# Patient Record
Sex: Male | Born: 1935 | Race: Black or African American | Hispanic: No | Marital: Married | Smoking: Never smoker
Health system: Southern US, Community
[De-identification: ages and names within clinical notes are randomized; demographics above are authoritative.]

## PROBLEM LIST (undated history)

## (undated) DIAGNOSIS — R269 Unspecified abnormalities of gait and mobility: Secondary | ICD-10-CM

## (undated) DIAGNOSIS — R569 Unspecified convulsions: Secondary | ICD-10-CM

## (undated) DIAGNOSIS — F0390 Unspecified dementia without behavioral disturbance: Secondary | ICD-10-CM

## (undated) DIAGNOSIS — R0989 Other specified symptoms and signs involving the circulatory and respiratory systems: Secondary | ICD-10-CM

## (undated) HISTORY — DX: Unspecified convulsions: R56.9

## (undated) HISTORY — PX: FOOT SURGERY: SHX648

---
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
## 1998-05-22 ENCOUNTER — Encounter: Payer: Self-pay | Admitting: Emergency Medicine

## 1998-05-22 ENCOUNTER — Inpatient Hospital Stay (HOSPITAL_COMMUNITY): Admission: EM | Admit: 1998-05-22 | Discharge: 1998-05-28 | Payer: Self-pay | Admitting: Emergency Medicine

## 1998-05-23 ENCOUNTER — Encounter: Payer: Self-pay | Admitting: Pulmonary Disease

## 1998-12-03 ENCOUNTER — Emergency Department (HOSPITAL_COMMUNITY): Admission: EM | Admit: 1998-12-03 | Discharge: 1998-12-03 | Payer: Self-pay | Admitting: Emergency Medicine

## 1998-12-06 ENCOUNTER — Emergency Department (HOSPITAL_COMMUNITY): Admission: EM | Admit: 1998-12-06 | Discharge: 1998-12-06 | Payer: Self-pay | Admitting: Emergency Medicine

## 1998-12-06 ENCOUNTER — Encounter: Payer: Self-pay | Admitting: Emergency Medicine

## 1998-12-11 ENCOUNTER — Encounter: Payer: Self-pay | Admitting: Emergency Medicine

## 1998-12-11 ENCOUNTER — Emergency Department (HOSPITAL_COMMUNITY): Admission: EM | Admit: 1998-12-11 | Discharge: 1998-12-11 | Payer: Self-pay | Admitting: Emergency Medicine

## 1998-12-18 ENCOUNTER — Encounter: Payer: Self-pay | Admitting: *Deleted

## 1998-12-18 ENCOUNTER — Emergency Department (HOSPITAL_COMMUNITY): Admission: EM | Admit: 1998-12-18 | Discharge: 1998-12-18 | Payer: Self-pay | Admitting: Emergency Medicine

## 1999-02-06 ENCOUNTER — Ambulatory Visit (HOSPITAL_COMMUNITY): Admission: RE | Admit: 1999-02-06 | Discharge: 1999-02-06 | Payer: Self-pay | Admitting: Family Medicine

## 1999-02-06 ENCOUNTER — Encounter: Payer: Self-pay | Admitting: Family Medicine

## 2001-06-18 ENCOUNTER — Encounter: Payer: Self-pay | Admitting: Internal Medicine

## 2001-06-18 ENCOUNTER — Inpatient Hospital Stay (HOSPITAL_COMMUNITY): Admission: EM | Admit: 2001-06-18 | Discharge: 2001-06-24 | Payer: Self-pay | Admitting: Emergency Medicine

## 2001-06-18 ENCOUNTER — Encounter: Payer: Self-pay | Admitting: Emergency Medicine

## 2002-02-27 ENCOUNTER — Encounter: Payer: Self-pay | Admitting: Emergency Medicine

## 2002-02-27 ENCOUNTER — Emergency Department (HOSPITAL_COMMUNITY): Admission: EM | Admit: 2002-02-27 | Discharge: 2002-02-27 | Payer: Self-pay | Admitting: Emergency Medicine

## 2004-07-26 ENCOUNTER — Emergency Department (HOSPITAL_COMMUNITY): Admission: EM | Admit: 2004-07-26 | Discharge: 2004-07-26 | Payer: Self-pay | Admitting: Emergency Medicine

## 2004-08-10 ENCOUNTER — Ambulatory Visit (HOSPITAL_COMMUNITY): Admission: RE | Admit: 2004-08-10 | Discharge: 2004-08-10 | Payer: Self-pay | Admitting: Gastroenterology

## 2006-05-06 ENCOUNTER — Inpatient Hospital Stay (HOSPITAL_COMMUNITY): Admission: EM | Admit: 2006-05-06 | Discharge: 2006-05-14 | Payer: Self-pay | Admitting: Emergency Medicine

## 2008-02-12 ENCOUNTER — Emergency Department (HOSPITAL_COMMUNITY): Admission: EM | Admit: 2008-02-12 | Discharge: 2008-02-12 | Payer: Self-pay | Admitting: Emergency Medicine

## 2009-12-15 ENCOUNTER — Observation Stay (HOSPITAL_COMMUNITY): Admission: EM | Admit: 2009-12-15 | Discharge: 2009-12-16 | Payer: Self-pay | Admitting: Emergency Medicine

## 2010-09-17 LAB — RPR: RPR Ser Ql: NONREACTIVE

## 2010-09-17 LAB — DIFFERENTIAL
Basophils Absolute: 0.1 10*3/uL (ref 0.0–0.1)
Eosinophils Absolute: 0 10*3/uL (ref 0.0–0.7)
Eosinophils Relative: 0 % (ref 0–5)
Monocytes Absolute: 0.8 10*3/uL (ref 0.1–1.0)

## 2010-09-17 LAB — COMPREHENSIVE METABOLIC PANEL
ALT: 14 U/L (ref 0–53)
AST: 18 U/L (ref 0–37)
Albumin: 3.5 g/dL (ref 3.5–5.2)
CO2: 28 mEq/L (ref 19–32)
Chloride: 100 mEq/L (ref 96–112)
GFR calc Af Amer: >60 mL/min (ref >60)
GFR calc non Af Amer: >60 mL/min (ref >60)
Potassium: 3.7 mEq/L (ref 3.5–5.1)
Sodium: 135 mEq/L (ref 135–145)
Total Bilirubin: 0.3 mg/dL (ref 0.3–1.2)

## 2010-09-17 LAB — CBC
HCT: 34.8 % — ABNORMAL LOW (ref 39.0–52.0)
Hemoglobin: 11.6 g/dL — ABNORMAL LOW (ref 13.0–17.0)
MCV: 97.6 fL (ref 78.0–100.0)
MCV: 98.1 fL (ref 78.0–100.0)
Platelets: 145 10*3/uL — ABNORMAL LOW (ref 150–400)
RBC: 3.81 MIL/uL — ABNORMAL LOW (ref 4.22–5.81)
WBC: 4.4 10*3/uL (ref 4.0–10.5)
WBC: 4.9 10*3/uL (ref 4.0–10.5)

## 2010-09-17 LAB — BASIC METABOLIC PANEL
BUN: 6 mg/dL (ref 6–23)
Chloride: 102 mEq/L (ref 96–112)
GFR calc non Af Amer: >60 mL/min (ref >60)
Potassium: 3.7 mEq/L (ref 3.5–5.1)
Sodium: 141 mEq/L (ref 135–145)

## 2010-09-17 LAB — URINALYSIS, ROUTINE W REFLEX MICROSCOPIC
Glucose, UA: NEGATIVE mg/dL
Hgb urine dipstick: NEGATIVE
Ketones, ur: NEGATIVE mg/dL
Protein, ur: NEGATIVE mg/dL
pH: 7 (ref 5.0–8.0)

## 2010-09-17 LAB — VITAMIN B12: Vitamin B-12: 450 pg/mL (ref 211–911)

## 2010-09-17 LAB — ALBUMIN: Albumin: 3.1 g/dL — ABNORMAL LOW (ref 3.5–5.2)

## 2010-09-17 LAB — VALPROIC ACID LEVEL: Valproic Acid Lvl: 49.7 ug/mL — ABNORMAL LOW (ref 50.0–100.0)

## 2010-09-17 LAB — PHENYTOIN LEVEL, FREE AND TOTAL
Phenytoin Bound: 13.9 mg/L
Phenytoin, Total: 16.3 mg/L (ref 10.0–20.0)

## 2010-09-17 LAB — TSH: TSH: 0.501 u[IU]/mL (ref 0.350–4.500)

## 2010-09-17 LAB — PHENYTOIN LEVEL, TOTAL: Phenytoin Lvl: 5.5 ug/mL — ABNORMAL LOW (ref 10.0–20.0)

## 2010-11-17 NOTE — H&P (Signed)
NAMEBOSTYN, BOGIE                 ACCOUNT NO.:  [id number]   MEDICAL RECORD NO.:  [id number]          PATIENT TYPE:  EMS   LOCATION:  MAJO                         FACILITY:  [facility]   PHYSICIAN:  Lonia Blood, M.D.DATE OF BIRTH:  [birth year]   DATE OF ADMISSION:  05/05/2006  DATE OF DISCHARGE:                              HISTORY & PHYSICAL   PRIMARY CARE PHYSICIAN:  Unassigned.   CHIEF COMPLAINT:  Altered mental status.   HISTORY OF PRESENT ILLNESS:  Mr. Samuel Kirk is a 76 year old gentleman  who presented to the [facility] Emergency Room with family this evening  with complaints of altered mental status.  No family is present at the  present time and further history is not available.  The patient himself  is confused and unable to provide a reliable history.  The information  that I have is provided by the emergency room physician.  Apparently,  the patient was noted to be confused by family today.  They stated that  he was stumbling around when he is normally able to walk.  They said  that his speech was garbled and unintelligible.  He was brought to the  emergency room for evaluation.  In the emergency room, the patient was  found to have an elevated Dilantin level at 33.  I was called to admit  the patient for Dilantin toxicity.  At the present time, the patient is  alert.  He Fahd not answer questions from the examiner.  He Zandon turn  and look at the examiner and he Monterrius follow simple questions.  He is  moving all 4 extremities spontaneously.   REVIEW OF SYSTEMS:  Comprehensive review of systems cannot be  accomplished, as the patient is altered in his mental state and unable  to provide history.   PAST MEDICAL HISTORY:  (Per old records.)  1. Possible history of hypertension.  2. Seizure disorder thought to be secondary to alcoholism.  3. Normal colonoscopy, February 2006.  4. History of alcoholism.   OUTPATIENT MEDICATIONS:  Doses unknown:  1. Depakote.  2.  Dilantin.  3. Tagamet.   ALLERGIES:  No known drug allergies per old records.   FAMILY HISTORY:  The patient's mother and father both died of old age  with no significant medical history per old records.   SOCIAL HISTORY:  Social history is not able to be obtained with the  exception to the fact that we know he previously chewed tobacco and was  an alcoholic.   DATA REVIEW:  White count is low at 2.3, platelet count was borderline  low at 119, hemoglobin is low at 12.8 with an MCV of 96.  Sodium is  normal, potassium is low at 3.4; chloride, bicarb, BUN and creatinine  are normal.  Serum glucose is elevated at 138.  Calcium is 9.3.  LFTs  are normal.  Albumin is marginally low-normal at 3.6.  Dilantin level is  33, which corrects to 40 at his present albumin level.  Depakote level  is 51.   CT scan of the head reveals no acute disease.  Twelve-lead EKG reveals sinus bradycardia at 58 beats per minute.   PHYSICAL EXAMINATION:  VITAL SIGNS:  Temperature 97.3, blood pressure  151/72, heart rate 58, respiratory rate 18 and O2 SAT is 100% on room  air, CBG 132.  GENERAL:  Thin, cachectic male in no acute respiratory distress.  HEENT:  Normocephalic, atraumatic.  Pupils are equal and sluggish  bilateral.  Oral cavity reveals poor dental health, but moist mucous  membranes.  NECK:  No JVD.  No lymphadenopathy.  LUNGS:  Clear to auscultation bilaterally without wheezes or rhonchi.  CARDIOVASCULAR:  Regular rate and rhythm without murmur, gallop or rub,  with normal S1 and S2.  ABDOMEN:  Nontender, non-distended, soft.  Bowel sounds present.  No  hepatosplenomegaly.  No rebound.  No ascites.  EXTREMITIES:  Cachectic extremities with no cyanosis, clubbing or edema.  NEUROLOGIC:  The patient is alert.  He mumbles unintelligible answers  when asked questions.  He is able to follow simple commands.  His facial  features are symmetric.  He moves all 4 extremities spontaneously and  there  is no Babinski.   IMPRESSION AND PLAN:  1. Altered mental status.  It is not clear if Mr. Sotelo is suffering      with altered mental status due to his Depakote level or due to      other toxins.  He has a history of alcoholism.  I Stephan check a      urine drug screen and also an alcohol level; these have not been      obtained in the emergency room.  I Delvon place the patient on      empiric thiamine and folate.  We Kowen place the patient in the      hospital for observation.  CT scan of the head has been obtained      and is without evidence of acute disease.  If the patient is      unimproved in the morning, we Giancarlo need to consider MRI of the head      to rule out cerebrovascular accident.  2. Dilantin toxicity.  The patient is indeed suffering with a      significant Dilantin toxicity.  We Cobain of course hold Dilantin.      We Parth hydrate the patient carefully.  3. History of alcoholism.  The patient's physical exam as well as his      near-pancytopenia suggest that he continues to abuse alcohol.  I      Sandro place him on empiric thiamine and folate.  Given that he is      confused at the present time, I Thao not place him on standing      benzodiazepines.  We Delwin need to follow him closely for alcohol      withdrawal.  4. Mild pancytopenia.  I suspect this is indicative of ongoing alcohol      abuse.  We Taft follow up a CBC in approximately 48 hours.      Lonia Blood, M.D.  Electronically Signed     JTM/MEDQ  D:  05/06/2006  T:  05/06/2006  Job:  [id number]

## 2010-11-17 NOTE — Discharge Summary (Signed)
[place]  Patient:    Samuel Kirk, Samuel Kirk Visit Number: [id number] MRN: [id number]          Service Type: MED Location: 5000 [id number] Attending Physician:  Phifer, Trinna Post Dictated by:   Ladell Pier, M.D. Admit Date:  06/18/2001 Disc. Date: 06/23/01                             Discharge Summary  DISCHARGE DIAGNOSES: 1. Altered mental status secondary to several medical problems including    alcoholism. 2. Seizure disorder secondary to alcohol. 3. History of ethanol abuse.  MEDICATIONS ON DISCHARGE: 1. Risperdal 1 mg p.o. b.i.d. 2. Multivitamin. 3. Depakote 750 mg p.o. q.h.s. 4. Thiamine 100 mg q.d. x 1 month.  FOLLOW-UP:  The patient can follow up with outpatient clinic at [phone].  CONSULTS:  Dr. Antonietta Breach, psychiatry, June 20, 2001.  PROCEDURES:  None.  HISTORY OF PRESENT ILLNESS:  Samuel Kirk is a 76 year old African-American male with past medical history significant for ethanol abuse leading to seizure disorder.  He recently moved from [place], [place] where he was living with his sister; he now lives with his daughter here in [place] since April 2002.  The patient has been out of medications for the past month.  Since Thanksgiving he had been acting strange with increased confusion, he refuses to bathe, he has not slept for the past two days.  The morning of admission he started yelling "the Shaune Pollack is coming" and going through the knife drawers in the kitchen.  He also had several spells, each lasting for a few seconds; his normal seizure pattern is tonic-clonic.  He has no complaints of chest pain, nausea, vomiting, diarrhea, or increased shortness of breath.  No headaches. Most of the information obtained for the interview was obtained from his daughter.  PAST MEDICAL HISTORY: 1. Ethanol abuse with DTs. 2. Seizure disorder. 3. Question of hypertension.  FAMILY HISTORY:  Mother died of old age.   Father died of old age.  Grandmother had Alzheimers.  SOCIAL HISTORY:  Chews tobacco.  Was a heavy drinker in the past; two years since he quit.  Lives with his daughter.  MEDICATIONS: 1. Depakote 250 mg three p.o. q.h.s. 2. Risperdal 0.5 mg p.o. q.12h. p.r.n. for agitation. 3. Multivitamin.  ALLERGIES:  No known drug allergies.  REVIEW OF SYSTEMS:  Positive for visual changes, dry cough, hypertension.  PHYSICAL EXAMINATION:  VITAL SIGNS:  Temperature 98, blood pressure 160/94, pulse 97, pulse oximetry 100% on room air.  GENERAL:  Patient is sitting up in bed, appears his stated age, in no apparent distress.  HEENT:  Head normocephalic, atraumatic.  Pupils are equal, round and reactive to light.  Small pterygium in the right eye.  Throat shows no erythema.  CARDIOVASCULAR:  Regular rate and rhythm, S1, S2, constant intensity.  No murmurs, rubs, or gallops.  PMI is at the left sternal border.  No JVD, no carotid bruits.  Enlarged A wave.  LUNGS:  Clear to auscultation bilaterally.  No wheezes, rhonchi, or crackles.  ABDOMEN:  Soft, nontender, nondistended.  Positive bowel sounds.  RECTAL:  Deferred.  EXTREMITIES:  No edema, 2+ DP pulse bilaterally.  Deformed fourth toe on the left foot.  LYMPHATICS:  No lymphadenopathy.  NEUROLOGIC:  Cranial nerves 2-12 intact.  Strength 5/5 throughout all extremities.  DTRs 2+ throughout.  PSYCHIATRIC:  He is oriented to place only.  Follows commands.  Poor insight.  HOSPITAL COURSE: #1 - ALTERED MENTAL STATUS:  Patient was worked up for altered mental status, was negative for infection, head CT was negative ruling out stroke, RPR was negative, HIV negative, TSH was within normal limits, B12 was normal, urine drug screen was negative.  EKG was negative, cardiac enzymes negative.  No organic etiology for patients altered mental status.  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 Jeanie Sewer of psychiatry.  Patient was started on risperidol 1 mg b.i.d. and mental status remained the same throughout his admission course.  #2 - SEIZURE DISORDER:  Patient has a history of seizure disorder because of his alcohol use.  He was out of his Depakote.  Patient was restarted on Depakote during his admission and at last blood draw a Depakote level was 45.1.  Patient had no episodes of seizure during his admission stay.  #3 - HYPERTENSION:  On admission, patients blood pressure was elevated. During his stay blood pressure normalized to 121/63 and stayed within normal range during his hospitalization.  DISPOSITION:  Patient Samuel Kirk be discharged to intermediate care facility.  LABORATORY DATA ON DISCHARGE:  Valproic acid 45.1.  Dilantin less than 0.1. B12 586.  Ethanol less than 10.  RPR negative.  HIV negative.  Magnesium 2.2. Cardiac enzymes with CK 93, MB 1.5, troponin 0.01.  TSH 1.258.  CMP:  Sodium 143, potassium 3.8, chloride 104, CO2 31, BUN 11, creatinine 0.1, glucose 112, calcium 9.9, total protein 8.2, albumin 3.7, AST 38, ALT 19, alk phos 73, total bili 1.3.  UA negative except for ketones 15 mg/dcl.  Urine drug screen negative.  CBC:  H&H 14.2/41.3, WBC 6.6, platelets 202.  Head CT showed atrophy and small-vessel disease, negative for acute abnormalities.  EKG showed normal sinus rhythm, rate of 75 beats per minute.  No ST elevation or depression. Dictated by:   Ladell Pier, M.D. Attending Physician:  Phifer, Trinna Post DD:  06/23/01 TD:  06/23/01 Job: [id number]

## 2010-11-17 NOTE — Discharge Summary (Signed)
Samuel Kirk, Samuel Kirk                 ACCOUNT NO.:  [id number]   MEDICAL RECORD NO.:  [id number]          PATIENT TYPE:  INP   LOCATION:  3729                         FACILITY:  [facility]   PHYSICIAN:  Madaline Savage, MD        DATE OF BIRTH:  10-Sep-1934   DATE OF ADMISSION:  05/05/2006  DATE OF DISCHARGE:  05/10/2006                                 DISCHARGE SUMMARY   PRIMARY CARE PHYSICIAN:  Unassigned.   PROCEDURES DONE IN THE HOSPITAL:  He had a CT of the head without contrast  done on August 06, 2005, which showed no acute intracranial pathology.  He  had an x-ray of the chest done on May 06, 2006, which showed limited  possible underlying COPD.  He had a MRA of the brain done on May 07, 2006, which showed brain atrophy without evidence of focal or acute finding.   HISTORY OF PRESENT ILLNESS:  Samuel Kirk is a 76 year old gentleman who has a  history of seizure disorder who presented to the [facility]  with altered mental status.  The patient was confused at the time and having  garbled speech.  At the time of admission, his Dilantin level was elevated  at 33 and he was admitted for observation.   HOSPITAL COURSE WITH PROBLEM LIST:  1. Altered mental status.  His altered mental status resolved and his      Dilantin level came down back to normal, so we believe it is the      Dilantin toxicity that caused his change in mental status.  We did a CT      of the head which was negative.  We did a MRI which did not show any      acute cerebrovascular accident.  He was taken off Dilantin for a few      days but now we are restarting his Dilantin at a lower dose.  He used      to take 100 mg 3 times daily.  We Viyaan start it at 100 mg twice a day.      The plan is to check a Dilantin level as an outpatient and adjust it      accordingly.  2. Dilantin toxicity, just resolved.  The last Dilantin level was within      the normal range and his CNS status is back to its  baseline.  3. History of seizure disorder.  He Hawken continue taking the Dilantin.  He      is also on Depakote 750 mg at bedtime.  Corrigan continue the medications.  4. History of alcoholism.  He is started on thiamine and folate while in      the hospital.  We Kevin continue it at the nursing home.   DISCHARGE MEDICATIONS:  1. Dilantin 100 mg twice daily.  2. Depakote 750 mg at bedtime.  3. Thiamine 100 mg daily.  4. Folic acid 1 mg daily.   DISPOSITION:  He Zacharey now be discharged to a nursing facility where he Arvle  get physical  therapy and followup.  He Dimarco followup with the physician at  the nursing home.  He Guilherme also get a Dilantin level in 1 week's time to  adjust the dose of Dilantin.      Madaline Savage, MD  Electronically Signed     PKN/MEDQ  D:  05/10/2006  T:  05/10/2006  Job:  [id number]

## 2010-11-17 NOTE — Discharge Summary (Signed)
Samuel Kirk, Samuel Kirk                 ACCOUNT NO.:  [id number]   MEDICAL RECORD NO.:  [id number]          PATIENT TYPE:  INP   LOCATION:  3729                         FACILITY:  [facility]   PHYSICIAN:  Madaline Savage, MD        DATE OF BIRTH:  03/27/1935   DATE OF ADMISSION:  05/05/2006  DATE OF DISCHARGE:                                 DISCHARGE SUMMARY   ADDENDUM TO DISCHARGE SUMMARY:  This gentleman did have a positive blood  culture while in the hospital.  A couple of blood cultures were done because  of change in mental status and one of them grew coagulase negative  staphylococcus.  The usual report was Gram negative cocci and clusters, so  we started him on Rocephin, but at the time of discharge, it has come back  as coagulase negative staph, which is staph epidermatitis which is  contaminant.  So we have stopped the Rocephin.  He never had a fever or  elevated white count or any source of infection.      Madaline Savage, MD  Electronically Signed     PKN/MEDQ  D:  05/10/2006  T:  05/10/2006  Job:  [id number]

## 2010-11-17 NOTE — Op Note (Signed)
NAMEHulon, Samuel Kirk                 ACCOUNT NO.:  [id number]   MEDICAL RECORD NO.:  [id number]          PATIENT TYPE:  AMB   LOCATION:  ENDO                         FACILITY:  [facility]   PHYSICIAN:  John C. Madilyn Fireman, M.D.    DATE OF BIRTH:  May 05, 1935   DATE OF PROCEDURE:  08/10/2004  DATE OF DISCHARGE:                                 OPERATIVE REPORT   INDICATIONS FOR PROCEDURE:  Average risk colon cancer screening.   DESCRIPTION OF PROCEDURE:  The patient placed in the left lateral decubitus  position then placed on the pulse monitor with continuous low-flow oxygen  delivered by nasal cannula. He was sedated with 75 mcg IV fentanyl and 9  milligrams IV Versed. The Olympus video colonoscope was inserted into the  rectum and advanced to cecum, confirmed by transillumination at McBurney's  point visualization of ileocecal valve and appendiceal orifice. The prep was  excellent. The cecum, ascending, transverse, descending and sigmoid colon  all appeared normal with no masses, polyps, diverticula or other mucosal  abnormalities.  The rectum likewise appeared normal and retroflexed view of  the anus revealed no obvious internal hemorrhoids. The scope was then drawn  and the patient returned to the recovery room in stable condition. He  tolerated the procedure well. There were no immediate complications.   IMPRESSION:  Normal colonoscopy.   PLAN:  Next colon screening by sigmoidoscopy in 5 years      JCH/MEDQ  D:  08/10/2004  T:  08/10/2004  Job:  [id number]   cc:   Burnell Blanks, M.D.

## 2011-03-30 LAB — CBC
HCT: 38.5 — ABNORMAL LOW
MCHC: 32.7
MCV: 97.9
Platelets: 140 — ABNORMAL LOW
RDW: 14
WBC: 3.7 — ABNORMAL LOW

## 2011-03-30 LAB — POCT I-STAT, CHEM 8
Calcium, Ion: 1.14
Chloride: 102
Glucose, Bld: 100 — ABNORMAL HIGH
HCT: 40
TCO2: 32

## 2011-03-30 LAB — URINALYSIS, ROUTINE W REFLEX MICROSCOPIC
Bilirubin Urine: NEGATIVE
Glucose, UA: NEGATIVE
Hgb urine dipstick: NEGATIVE
Ketones, ur: NEGATIVE
Protein, ur: NEGATIVE
Urobilinogen, UA: 2 — ABNORMAL HIGH

## 2011-03-30 LAB — DIFFERENTIAL
Basophils Absolute: 0.1
Basophils Relative: 3 — ABNORMAL HIGH
Eosinophils Absolute: 0
Eosinophils Relative: 1
Monocytes Absolute: 0.5
Neutrophils Relative %: 42 — ABNORMAL LOW

## 2011-03-30 LAB — VALPROIC ACID LEVEL: Valproic Acid Lvl: 45.3 — ABNORMAL LOW

## 2011-03-30 LAB — PHENYTOIN LEVEL, TOTAL: Phenytoin Lvl: 4.9 — ABNORMAL LOW

## 2013-02-27 ENCOUNTER — Encounter (HOSPITAL_COMMUNITY): Payer: Self-pay

## 2013-02-27 ENCOUNTER — Emergency Department (HOSPITAL_COMMUNITY): Payer: Medicare Other

## 2013-02-27 ENCOUNTER — Emergency Department (HOSPITAL_COMMUNITY)
Admission: EM | Admit: 2013-02-27 | Discharge: 2013-02-27 | Disposition: A | Discharge status: Home / Self Care | Payer: Medicare Other | Attending: Emergency Medicine | Admitting: Emergency Medicine

## 2013-02-27 DIAGNOSIS — R296 Repeated falls: Secondary | ICD-10-CM | POA: Insufficient documentation

## 2013-02-27 DIAGNOSIS — Z043 Encounter for examination and observation following other accident: Secondary | ICD-10-CM | POA: Insufficient documentation

## 2013-02-27 DIAGNOSIS — G40909 Epilepsy, unspecified, not intractable, without status epilepticus: Secondary | ICD-10-CM

## 2013-02-27 DIAGNOSIS — R7889 Finding of other specified substances, not normally found in blood: Secondary | ICD-10-CM

## 2013-02-27 DIAGNOSIS — L089 Local infection of the skin and subcutaneous tissue, unspecified: Secondary | ICD-10-CM

## 2013-02-27 DIAGNOSIS — F0390 Unspecified dementia without behavioral disturbance: Secondary | ICD-10-CM | POA: Insufficient documentation

## 2013-02-27 DIAGNOSIS — R7989 Other specified abnormal findings of blood chemistry: Secondary | ICD-10-CM | POA: Insufficient documentation

## 2013-02-27 DIAGNOSIS — B351 Tinea unguium: Secondary | ICD-10-CM

## 2013-02-27 DIAGNOSIS — Y9301 Activity, walking, marching and hiking: Secondary | ICD-10-CM | POA: Insufficient documentation

## 2013-02-27 DIAGNOSIS — Y9229 Other specified public building as the place of occurrence of the external cause: Secondary | ICD-10-CM | POA: Insufficient documentation

## 2013-02-27 HISTORY — DX: Unspecified dementia, unspecified severity, without behavioral disturbance, psychotic disturbance, mood disturbance, and anxiety: F03.90

## 2013-02-27 LAB — COMPREHENSIVE METABOLIC PANEL
AST: 29 U/L (ref 0–37)
Albumin: 3.1 g/dL — ABNORMAL LOW (ref 3.5–5.2)
Calcium: 8.7 mg/dL (ref 8.4–10.5)
Chloride: 103 mEq/L (ref 96–112)
Creatinine, Ser: 0.77 mg/dL (ref 0.50–1.35)
Sodium: 140 mEq/L (ref 135–145)

## 2013-02-27 LAB — POCT I-STAT TROPONIN I: Troponin i, poc: 0.04 ng/mL (ref 0.00–0.08)

## 2013-02-27 LAB — URINALYSIS W MICROSCOPIC + REFLEX CULTURE
Glucose, UA: NEGATIVE mg/dL
Hgb urine dipstick: NEGATIVE
Specific Gravity, Urine: 1.019 (ref 1.005–1.030)

## 2013-02-27 LAB — CBC WITH DIFFERENTIAL/PLATELET
Basophils Absolute: 0 10*3/uL (ref 0.0–0.1)
Basophils Relative: 1 % (ref 0–1)
Eosinophils Relative: 0 % (ref 0–5)
HCT: 33 % — ABNORMAL LOW (ref 39.0–52.0)
MCHC: 35.2 g/dL (ref 30.0–36.0)
Monocytes Absolute: 0.7 10*3/uL (ref 0.1–1.0)
Neutro Abs: 2.6 10*3/uL (ref 1.7–7.7)
Platelets: 185 10*3/uL (ref 150–400)
RDW: 14.3 % (ref 11.5–15.5)

## 2013-02-27 MED ORDER — PHENYTOIN 50 MG PO CHEW
400.0000 mg | CHEWABLE_TABLET | Freq: Once | ORAL | Status: AC
  Administered 2013-02-27: 400 mg via ORAL
  Filled 2013-02-27: qty 8

## 2013-02-27 MED ORDER — PHENYTOIN SODIUM EXTENDED 100 MG PO CAPS: 100.0000 mg | ORAL_CAPSULE | Freq: Two times a day (BID) | ORAL | Status: DC

## 2013-02-27 MED ORDER — CEPHALEXIN 500 MG PO CAPS: 500.0000 mg | ORAL_CAPSULE | Freq: Four times a day (QID) | ORAL | Status: DC

## 2013-02-27 MED ORDER — DIVALPROEX SODIUM ER 250 MG PO TB24
250.0000 mg | ORAL_TABLET | Freq: Once | ORAL | Status: AC
  Administered 2013-02-27: 250 mg via ORAL
  Filled 2013-02-27: qty 1

## 2013-02-27 MED ORDER — DIVALPROEX SODIUM ER 250 MG PO TB24: 250.0000 mg | ORAL_TABLET | Freq: Every day | ORAL | Status: DC

## 2013-02-27 NOTE — ED Notes (Signed)
MD at bedside. Dr. Wofford. 

## 2013-02-27 NOTE — ED Notes (Signed)
Patient transported to CT 

## 2013-02-27 NOTE — ED Notes (Addendum)
At bedside with Samuel Kirk. To assist with toenail removal. Pt tolerated procedure well. Pt states that he has not pain at this time.

## 2013-02-27 NOTE — ED Notes (Signed)
Phlebotomy at bedside to draw labs

## 2013-02-27 NOTE — ED Provider Notes (Signed)
CSN: [id number]     Arrival date & time 02/27/13  1733 History   First MD Initiated Contact with Patient 02/27/13 1736     Chief Complaint  Patient presents with  . Fall   (Consider location/radiation/quality/duration/timing/severity/associated sxs/prior Treatment) HPI Patient is a 78 year old male who presents to emergency department today with after a possible syncopal episode at a grocery store. According to bystanders who called report a different story, patient either had a syncopal episode, fall, or possible seizure. Patient was seen walking with his cane through grocery store and then was seen falling on the floor with possible jerking movements that were seen by one bystander but not the other. Patient seemed to be confused after. According to the patient's son who did not witness an episode, patient has history of similar episodes in the past where he is unresponsive for several seconds and this always follows by brief confusion. He has seen his primary care doctor for this and was told this or not seizures but it's related to his dementia. Patient apparently left home where he lives and walk to the grocery store on his own. Patient denies any complaints. He does not appear to be in any distress and at his baseline. Pt did not have urinary incontinence or bites to his tongue. Per ems, NSR on ECG, no apparent injuries.  History reviewed. No pertinent past medical history. History reviewed. No pertinent past surgical history. No family history on file. History  Substance Use Topics  . Smoking status: Not on file  . Smokeless tobacco: Not on file  . Alcohol Use: Not on file    Review of Systems  Unable to perform ROS: Dementia    Allergies  Review of patient's allergies indicates not on file.  Home Medications  No current outpatient prescriptions on file. SpO2 99% Physical Exam  Nursing note and vitals reviewed. Constitutional: He appears well-developed and well-nourished. No  distress.  HENT:  Head: Normocephalic and atraumatic.  Eyes: Conjunctivae and EOM are normal. Pupils are equal, round, and reactive to light.  Neck: Neck supple.  Cardiovascular: Normal rate, regular rhythm and normal heart sounds.   Pulmonary/Chest: Effort normal. No respiratory distress. He has no wheezes. He has no rales.  Abdominal: Soft. Bowel sounds are normal. He exhibits no distension. There is no tenderness. There is no rebound.  Musculoskeletal: He exhibits no edema.  Extensive fungal infection of toenails. Nails are very long and unkempt. Left third toe toenail is curled backwards on itself and appears to be dissecting that toe with its. There is surrounding erythema, tenderness, orders drainage from the wound  Neurological: He is alert.  Patient is disoriented. Follows simple commands. Father 5 and equal upper and lower extremity strength bilaterally. Grip strength is equal. No pronator drift.  Skin: Skin is warm and dry.  Psychiatric: He has a normal mood and affect. His behavior is normal.    ED Course  Procedures (including critical care time) Labs Review Labs Reviewed  CBC WITH DIFFERENTIAL - Abnormal; Notable for the following:    RBC 3.49 (*)    Hemoglobin 11.6 (*)    HCT 33.0 (*)    Monocytes Relative 17 (*)    All other components within normal limits  COMPREHENSIVE METABOLIC PANEL - Abnormal; Notable for the following:    Potassium 3.4 (*)    Albumin 3.1 (*)    GFR calc non Af Amer 87 (*)    All other components within normal limits  URINALYSIS W MICROSCOPIC +  REFLEX CULTURE  POCT I-STAT TROPONIN I   Imaging Review Dg Chest 2 View  02/27/2013   *RADIOLOGY REPORT*  Clinical Data: Fall.  Left foot and ankle pain.  Dementia.  CHEST - 2 VIEW  Comparison: None.  Findings: Borderline heart size for projection.  Markedly tortuous thoracic aorta.  There is no airspace disease.  No pleural effusion.  No pneumothorax or displaced rib fracture identified in this patient  with history of fall.  The thoracic spinal alignment and vertebral body height is within normal limits with a mild S- shaped thoracic scoliosis.  IMPRESSION: No acute abnormality.  Tortuous ectatic thoracic aorta.   Original Report Authenticated By: Andreas Newport, M.D.   Dg Ankle Complete Left  02/27/2013   *RADIOLOGY REPORT*  Clinical Data: Left foot and ankle pain following a fall.  LEFT ANKLE COMPLETE - 3+ VIEW  Comparison: None.  Findings: The patient refused to cooperate with the examination. The ankle mortise appears congruent.  The talar dome is intact. There is no fracture identified.  Small vessel atherosclerotic calcification is present.  IMPRESSION: No acute abnormality.   Original Report Authenticated By: Andreas Newport, M.D.   Ct Head Wo Contrast  02/27/2013   CLINICAL DATA:  Seizure, fall.  EXAM: CT HEAD WITHOUT CONTRAST  CT CERVICAL SPINE WITHOUT CONTRAST  TECHNIQUE: Multidetector CT imaging of the head and cervical spine was performed following the standard protocol without intravenous contrast. Multiplanar CT image reconstructions of the cervical spine were also generated.  COMPARISON:  12/15/2009  FINDINGS: CT HEAD FINDINGS  There is atrophy and chronic small vessel disease changes. No acute intracranial abnormality. Specifically, no hemorrhage, hydrocephalus, mass lesion, acute infarction, or significant intracranial injury. No acute calvarial abnormality.  CT CERVICAL SPINE FINDINGS  Degenerative disc disease at C3-4. Normal alignment. Moderate diffuse left degenerative facet disease. No fracture. Prevertebral soft tissues are normal.  IMPRESSION: CT HEAD IMPRESSION  No acute intracranial abnormality.  CT CERVICAL SPINE IMPRESSION  Degenerative changes. No acute bony abnormality.   Electronically Signed   By: Charlett Nose   On: 02/27/2013 19:09   Ct Cervical Spine Wo Contrast  02/27/2013   CLINICAL DATA:  Seizure, fall.  EXAM: CT HEAD WITHOUT CONTRAST  CT CERVICAL SPINE WITHOUT CONTRAST   TECHNIQUE: Multidetector CT imaging of the head and cervical spine was performed following the standard protocol without intravenous contrast. Multiplanar CT image reconstructions of the cervical spine were also generated.  COMPARISON:  12/15/2009  FINDINGS: CT HEAD FINDINGS  There is atrophy and chronic small vessel disease changes. No acute intracranial abnormality. Specifically, no hemorrhage, hydrocephalus, mass lesion, acute infarction, or significant intracranial injury. No acute calvarial abnormality.  CT CERVICAL SPINE FINDINGS  Degenerative disc disease at C3-4. Normal alignment. Moderate diffuse left degenerative facet disease. No fracture. Prevertebral soft tissues are normal.  IMPRESSION: CT HEAD IMPRESSION  No acute intracranial abnormality.  CT CERVICAL SPINE IMPRESSION  Degenerative changes. No acute bony abnormality.   Electronically Signed   By: Charlett Nose   On: 02/27/2013 19:09   Dg Foot Complete Left  02/27/2013   *RADIOLOGY REPORT*  Clinical Data: Fall.  Left foot and ankle pain.  LEFT FOOT - COMPLETE 3+ VIEW  Comparison: None.  Findings: The patient refused cooperate with the examination.  The toes are dorsiflexed at the time of imaging.  There is no displaced fracture identified.  Bunion and hallux valgus deformity is present.  IMPRESSION: No gross acute abnormality.   Original Report Authenticated By: Andreas Newport,  M.D.    MDM   1. Seizure disorder   2. Toe infection   3. Onychomycosis of toenail   4. Dilantin level too low      Patient emergency department with possible syncopal episode versus seizure versus a fall. He is demented and unable to provide much history. Per family this is not the first time this has happened. He has had multiple similar episodes in the past and does have history of seizure disorder however they state that his EEG was negative. initially family did not remember patient's medications however after pharmacy review, patient is on Dilantin and  Depakote. Levels added to his blood work. His electrolytes are remarkable. His x-rays negative as well.  His left middle toe is concerning for possible infection. I have cut the nail off of that toe using a ring cutter because I had no luck using scissors or scalpel. The toenail was removed from where it penetrated the skin of the toe. The toe was cleaned and irrigated. Rosie start patient on antibiotics. He Dmitry need close followup with primary care doctor and podiatry.  10:32 PM [facility] Dilantin and Depakote levels are below therapeutic. Patient is feeling now admits that he did not take his medications for 2 days. Patient did run out and unable to get a refill from PCP until Tuesday. I have loaded him in emergency Department 400 mg of Dilantin by mouth. Patient Chelsea take another 300 mg tonight before bedtime. Is instructed to take 300 in the morning and then continue 100 mg twice a day as he is prescribed. He Amontae also continue on his Depakote. I have prescribed Keflex for his toe infection. He is instructed to take that until all gone. He is to followup closely with his primary care Dr. and I  recommended referral to a podiatrist.  Filed Vitals:   02/27/13 2045 02/27/13 2130 02/27/13 2133 02/27/13 2145  BP: 125/80 150/86 150/86 167/82  Pulse: 55 54 55 60  Temp:      TempSrc:      Resp: 14 16 20    SpO2: 100% 100% 100% 100%     Lottie Mussel, PA-C 02/28/13 0108

## 2013-02-27 NOTE — ED Provider Notes (Signed)
Medical screening examination/treatment/procedure(s) were conducted as a shared visit with non-physician practitioner(s) and myself.  I personally evaluated the patient during the encounter  78 yo male with dementia presenting after being found on the ground at the store by bystanders.  Family reports that he has had many such episodes, which have been worked up by his PCP for seizures vs syncope.  No witnessed LOC this episode.  No distress on exam.  Heart sounds normal, no m/r/g.  Lungs CTA.  Extensive onychomycosis.  Left 3rd toenail is curled backwards and bisecting skin of toe.  No significant erythema, but does have foul odor.  Workup was unremarkable, pt was at baseline mental status, felt to be stable for discharge to follow up with PCP and podiatry.  Antibiotics initiated.   Clinical Impression: 1. Fall 2. Toe infection   Candyce Churn, MD 02/28/13 1143

## 2013-02-27 NOTE — ED Notes (Signed)
Pt arrives [facility] from grocery store. Bystanders report pt had a "seizure." Another bystander reports pt "fell out." EMS reports pt was conscious and able to communicate. Communication improved through out transport.

## 2013-02-28 NOTE — ED Provider Notes (Signed)
Medical screening examination/treatment/procedure(s) were conducted as a shared visit with non-physician practitioner(s) and myself.  I personally evaluated the patient during the encounter.   Please see my separate note.     Candyce Churn, MD 02/28/13 1318

## 2018-12-31 ENCOUNTER — Other Ambulatory Visit: Payer: Self-pay

## 2018-12-31 ENCOUNTER — Emergency Department (HOSPITAL_COMMUNITY): Payer: Medicare Other

## 2018-12-31 ENCOUNTER — Encounter (HOSPITAL_COMMUNITY): Payer: Self-pay

## 2018-12-31 ENCOUNTER — Emergency Department (HOSPITAL_COMMUNITY)
Admission: EM | Admit: 2018-12-31 | Discharge: 2019-01-01 | Disposition: A | Discharge status: Home / Self Care | Payer: Medicare Other | Attending: Emergency Medicine | Admitting: Emergency Medicine

## 2018-12-31 DIAGNOSIS — R1084 Generalized abdominal pain: Secondary | ICD-10-CM | POA: Insufficient documentation

## 2018-12-31 DIAGNOSIS — F0390 Unspecified dementia without behavioral disturbance: Secondary | ICD-10-CM | POA: Diagnosis not present

## 2018-12-31 DIAGNOSIS — F17290 Nicotine dependence, other tobacco product, uncomplicated: Secondary | ICD-10-CM | POA: Insufficient documentation

## 2018-12-31 DIAGNOSIS — R1033 Periumbilical pain: Secondary | ICD-10-CM | POA: Diagnosis present

## 2018-12-31 LAB — URINALYSIS, ROUTINE W REFLEX MICROSCOPIC
Bilirubin Urine: NEGATIVE
Glucose, UA: NEGATIVE mg/dL
Ketones, ur: NEGATIVE mg/dL
Leukocytes,Ua: NEGATIVE
Nitrite: NEGATIVE
Protein, ur: NEGATIVE mg/dL
Specific Gravity, Urine: >1.046 — ABNORMAL HIGH (ref 1.005–1.030)
pH: 5 (ref 5.0–8.0)

## 2018-12-31 LAB — CBC
HCT: 45.1 % (ref 39.0–52.0)
Hemoglobin: 14.8 g/dL (ref 13.0–17.0)
MCH: 34.5 pg — ABNORMAL HIGH (ref 26.0–34.0)
MCHC: 32.8 g/dL (ref 30.0–36.0)
MCV: 105.1 fL — ABNORMAL HIGH (ref 80.0–100.0)
Platelets: 234 10*3/uL (ref 150–400)
RBC: 4.29 MIL/uL (ref 4.22–5.81)
RDW: 14.2 % (ref 11.5–15.5)
WBC: 4.2 10*3/uL (ref 4.0–10.5)
nRBC: 0.5 % — ABNORMAL HIGH (ref 0.0–0.2)

## 2018-12-31 LAB — COMPREHENSIVE METABOLIC PANEL
ALT: 8 U/L (ref 0–44)
AST: 17 U/L (ref 15–41)
Albumin: 4.4 g/dL (ref 3.5–5.0)
Alkaline Phosphatase: 106 U/L (ref 38–126)
Anion gap: 8 (ref 5–15)
BUN: 13 mg/dL (ref 8–23)
CO2: 26 mmol/L (ref 22–32)
Calcium: 9.2 mg/dL (ref 8.9–10.3)
Chloride: 104 mmol/L (ref 98–111)
Creatinine, Ser: 0.94 mg/dL (ref 0.61–1.24)
GFR calc Af Amer: >60 mL/min (ref >60)
GFR calc non Af Amer: >60 mL/min (ref >60)
Glucose, Bld: 101 mg/dL — ABNORMAL HIGH (ref 70–99)
Potassium: 4.5 mmol/L (ref 3.5–5.1)
Sodium: 138 mmol/L (ref 135–145)
Total Bilirubin: 0.7 mg/dL (ref 0.3–1.2)
Total Protein: 8.8 g/dL — ABNORMAL HIGH (ref 6.5–8.1)

## 2018-12-31 LAB — LIPASE, BLOOD: Lipase: 25 U/L (ref 11–51)

## 2018-12-31 MED ORDER — SODIUM CHLORIDE (PF) 0.9 % IJ SOLN
INTRAMUSCULAR | Status: AC
  Filled 2018-12-31: qty 50

## 2018-12-31 MED ORDER — SODIUM CHLORIDE 0.9% FLUSH: 3.0000 mL | Freq: Once | INTRAVENOUS | Status: DC

## 2018-12-31 MED ORDER — IOHEXOL 300 MG/ML  SOLN
100.0000 mL | Freq: Once | INTRAMUSCULAR | Status: AC | PRN
  Administered 2018-12-31: 21:00:00 100 mL via INTRAVENOUS

## 2018-12-31 NOTE — ED Notes (Signed)
Patient successfully co,pleted PO challenge

## 2018-12-31 NOTE — ED Provider Notes (Signed)
Emergency Department Provider Note   I have reviewed the triage vital signs and the nursing notes.   HISTORY  Chief Complaint Abdominal Pain   HPI Samuel Kirk is a 84 y.o. male with PMH of dementia presents to the emergency department for evaluation of abdominal pain.  Patient points to the periumbilical area and states it began this morning.  Denies any nausea, vomiting, diarrhea.  Level 5 caveat applies with patient's history of dementia. He denies any CP or SOB.    Past Medical History:  Diagnosis Date  . Dementia (HCC)     There are no active problems to display for this patient.   History reviewed. No pertinent surgical history.  Allergies Patient has no known allergies.  No family history on file.  Social History Social History   Tobacco Use  . Smoking status: Never Smoker  . Smokeless tobacco: Current User  Substance Use Topics  . Alcohol use: No  . Drug use: No    Review of Systems  Level 5 caveat: Dementia.  ____________________________________________   PHYSICAL EXAM:  VITAL SIGNS: ED Triage Vitals [12/31/18 1604]  Enc Vitals Group     BP 115/73     Pulse Rate 69     Resp 14     Temp 98.4 F (36.9 C)     Temp Source Oral     SpO2 100 %     Weight 150 lb (68 kg)     Height 6' (1.829 m)   Constitutional: Alert but confused. Well appearing and in no acute distress. Eyes: Conjunctivae are normal.  Head: Atraumatic. Nose: No congestion/rhinnorhea. Mouth/Throat: Mucous membranes are moist.  Neck: No stridor.   Cardiovascular: Normal rate, regular rhythm. Good peripheral circulation. Grossly normal heart sounds.   Respiratory: Normal respiratory effort.  No retractions. Lungs CTAB. Gastrointestinal: Soft with mild periumbilical tenderness. No distention.  Musculoskeletal: No lower extremity tenderness nor edema. No gross deformities of extremities. Neurologic:  Normal speech and language. No gross focal neurologic deficits are  appreciated.  Skin:  Skin is warm, dry and intact. No rash noted.  ____________________________________________   LABS (all labs ordered are listed, but only abnormal results are displayed)  Labs Reviewed  COMPREHENSIVE METABOLIC PANEL - Abnormal; Notable for the following components:      Result Value   Glucose, Bld 101 (*)    Total Protein 8.8 (*)    All other components within normal limits  CBC - Abnormal; Notable for the following components:   MCV 105.1 (*)    MCH 34.5 (*)    nRBC 0.5 (*)    All other components within normal limits  URINALYSIS, ROUTINE W REFLEX MICROSCOPIC - Abnormal; Notable for the following components:   Specific Gravity, Urine >1.046 (*)    Hgb urine dipstick SMALL (*)    Bacteria, UA RARE (*)    All other components within normal limits  LIPASE, BLOOD   ____________________________________________  RADIOLOGY  Ct Abdomen Pelvis W Contrast  Result Date: 12/31/2018 CLINICAL DATA:  Abdominal pain EXAM: CT ABDOMEN AND PELVIS WITHOUT CONTRAST TECHNIQUE: Multidetector CT imaging of the abdomen and pelvis was performed following the standard protocol without IV contrast. COMPARISON:  None. FINDINGS: Lower chest: No acute abnormality. Hepatobiliary: No focal hepatic abnormality. Gallbladder unremarkable. Pancreas: No focal abnormality or ductal dilatation. Spleen: No focal abnormality.  Normal size. Adrenals/Urinary Tract: Severe right hydronephrosis. No visible stones. This may be related to chronic UPJ obstruction. Mild overlying cortical thinning. No hydronephrosis on the  left. Adrenal glands and urinary bladder unremarkable. Stomach/Bowel: Few mildly prominent central small bowel loops. Gas and fluid noted throughout the large and small bowel. No convincing evidence for bowel obstruction. No transition point. Appendix is normal. Vascular/Lymphatic: Aortic atherosclerosis. No enlarged abdominal or pelvic lymph nodes. Reproductive: No visible focal abnormality.  Other: No free fluid or free air. Musculoskeletal: No acute bony abnormality. IMPRESSION: Severe right hydronephrosis. The right ureter is decompressed and difficult to follow. This may reflect chronic UPJ obstruction. Mildly prominent central small bowel loops may reflect mild focal ileus. No transition point to suggest small bowel obstruction. Fluid and gas noted throughout the large bowel could reflect diarrhea. Electronically Signed   By: Rolm Baptise M.D.   On: 12/31/2018 21:37    ____________________________________________   PROCEDURES  Procedure(s) performed:   Procedures  None  ____________________________________________   INITIAL IMPRESSION / ASSESSMENT AND PLAN / ED COURSE  Pertinent labs & imaging results that were available during my care of the patient were reviewed by me and considered in my medical decision making (see chart for details).   Patient presents to the emergency department for evaluation of abdominal pain.  Dementia limits history by patient does have focal, periumbilical tenderness on exam.  No rebound or guarding.  Lower suspicion for ACS the location of pain and reproducibility on exam.  Plan for screening labs and CT scan.  Patient lab work reviewed.  Lipase normal.  Kidney function normal.  CT scan performed with contrast but no contrast on image.  No infiltration in the arm.  Noncontrast CT shows severe right hydronephrosis which is likely chronic without evidence of stone.  Patient Samuel Kirk likely need outpatient urology follow-up.  Symptoms not totally consistent with ileus but plan for PO challenge and follow UA.   Attempted to reach the family for an update but unsuccessful.  ____________________________________________  FINAL CLINICAL IMPRESSION(S) / ED DIAGNOSES  Final diagnoses:  Generalized abdominal pain     MEDICATIONS GIVEN DURING THIS VISIT:  Medications  iohexol (OMNIPAQUE) 300 MG/ML solution 100 mL (100 mLs Intravenous Contrast Given  12/31/18 2047)     Note:  This document was prepared using Dragon voice recognition software and may include unintentional dictation errors.  Nanda Quinton, MD Emergency Medicine    , [facility], MD 01/01/19 1058

## 2018-12-31 NOTE — ED Notes (Signed)
Patient given saltine crackers and water  ? ?

## 2018-12-31 NOTE — ED Triage Notes (Signed)
Per daughter, pt has hx of dementia. Pt has been c/o abdominal pain, mostly in the umbilical area. Denies N/V/D with this RN.

## 2018-12-31 NOTE — ED Notes (Signed)
IV attempts x 3 with no success.

## 2019-01-01 NOTE — ED Provider Notes (Signed)
Patient signed out to me at shift change.  Has intra-and abdominal pain.  CT shows chronic hydronephrosis.  Urinalysis is negative.  Patient tolerating fluids by mouth.  Plan at signout is to discharge pending normal urinalysis and passing PO challenge.   Montine Circle, PA-C 01/01/19 0002    Margette Fast, MD 01/01/19 1136

## 2019-02-07 ENCOUNTER — Emergency Department (HOSPITAL_COMMUNITY): Payer: Medicare Other

## 2019-02-07 ENCOUNTER — Other Ambulatory Visit: Payer: Self-pay

## 2019-02-07 ENCOUNTER — Emergency Department (HOSPITAL_COMMUNITY)
Admission: EM | Admit: 2019-02-07 | Discharge: 2019-02-08 | Disposition: A | Discharge status: Home / Self Care | Payer: Medicare Other | Attending: Emergency Medicine | Admitting: Emergency Medicine

## 2019-02-07 ENCOUNTER — Encounter (HOSPITAL_COMMUNITY): Payer: Self-pay

## 2019-02-07 DIAGNOSIS — Z79899 Other long term (current) drug therapy: Secondary | ICD-10-CM | POA: Insufficient documentation

## 2019-02-07 DIAGNOSIS — W19XXXA Unspecified fall, initial encounter: Secondary | ICD-10-CM | POA: Insufficient documentation

## 2019-02-07 DIAGNOSIS — F0390 Unspecified dementia without behavioral disturbance: Secondary | ICD-10-CM | POA: Diagnosis not present

## 2019-02-07 DIAGNOSIS — R2243 Localized swelling, mass and lump, lower limb, bilateral: Secondary | ICD-10-CM | POA: Diagnosis not present

## 2019-02-07 DIAGNOSIS — E876 Hypokalemia: Secondary | ICD-10-CM | POA: Diagnosis not present

## 2019-02-07 DIAGNOSIS — R569 Unspecified convulsions: Secondary | ICD-10-CM | POA: Diagnosis not present

## 2019-02-07 DIAGNOSIS — Y998 Other external cause status: Secondary | ICD-10-CM | POA: Insufficient documentation

## 2019-02-07 DIAGNOSIS — S0083XA Contusion of other part of head, initial encounter: Secondary | ICD-10-CM | POA: Diagnosis not present

## 2019-02-07 DIAGNOSIS — Y929 Unspecified place or not applicable: Secondary | ICD-10-CM | POA: Insufficient documentation

## 2019-02-07 DIAGNOSIS — Y9389 Activity, other specified: Secondary | ICD-10-CM | POA: Diagnosis not present

## 2019-02-07 DIAGNOSIS — S0990XA Unspecified injury of head, initial encounter: Secondary | ICD-10-CM | POA: Diagnosis present

## 2019-02-07 DIAGNOSIS — R600 Localized edema: Secondary | ICD-10-CM

## 2019-02-07 DIAGNOSIS — F17228 Nicotine dependence, chewing tobacco, with other nicotine-induced disorders: Secondary | ICD-10-CM | POA: Insufficient documentation

## 2019-02-07 LAB — CBC WITH DIFFERENTIAL/PLATELET
Abs Immature Granulocytes: 0.01 10*3/uL (ref 0.00–0.07)
Basophils Absolute: 0 10*3/uL (ref 0.0–0.1)
Basophils Relative: 0 %
Eosinophils Absolute: 0 10*3/uL (ref 0.0–0.5)
Eosinophils Relative: 0 %
HCT: 33.4 % — ABNORMAL LOW (ref 39.0–52.0)
Hemoglobin: 10.9 g/dL — ABNORMAL LOW (ref 13.0–17.0)
Immature Granulocytes: 0 %
Lymphocytes Relative: 30 %
Lymphs Abs: 0.9 10*3/uL (ref 0.7–4.0)
MCH: 33.3 pg (ref 26.0–34.0)
MCHC: 32.6 g/dL (ref 30.0–36.0)
MCV: 102.1 fL — ABNORMAL HIGH (ref 80.0–100.0)
Monocytes Absolute: 0.4 10*3/uL (ref 0.1–1.0)
Monocytes Relative: 15 %
Neutro Abs: 1.5 10*3/uL — ABNORMAL LOW (ref 1.7–7.7)
Neutrophils Relative %: 55 %
Platelets: 167 10*3/uL (ref 150–400)
RBC: 3.27 MIL/uL — ABNORMAL LOW (ref 4.22–5.81)
RDW: 13.8 % (ref 11.5–15.5)
WBC: 2.9 10*3/uL — ABNORMAL LOW (ref 4.0–10.5)
nRBC: 0 % (ref 0.0–0.2)

## 2019-02-07 LAB — URINALYSIS, ROUTINE W REFLEX MICROSCOPIC
Bilirubin Urine: NEGATIVE
Glucose, UA: NEGATIVE mg/dL
Hgb urine dipstick: NEGATIVE
Ketones, ur: NEGATIVE mg/dL
Leukocytes,Ua: NEGATIVE
Nitrite: NEGATIVE
Protein, ur: NEGATIVE mg/dL
Specific Gravity, Urine: 1.011 (ref 1.005–1.030)
pH: 6 (ref 5.0–8.0)

## 2019-02-07 LAB — COMPREHENSIVE METABOLIC PANEL
ALT: 8 U/L (ref 0–44)
AST: 14 U/L — ABNORMAL LOW (ref 15–41)
Albumin: 3.2 g/dL — ABNORMAL LOW (ref 3.5–5.0)
Alkaline Phosphatase: 83 U/L (ref 38–126)
Anion gap: 8 (ref 5–15)
BUN: 8 mg/dL (ref 8–23)
CO2: 29 mmol/L (ref 22–32)
Calcium: 8.5 mg/dL — ABNORMAL LOW (ref 8.9–10.3)
Chloride: 105 mmol/L (ref 98–111)
Creatinine, Ser: 0.7 mg/dL (ref 0.61–1.24)
GFR calc Af Amer: >60 mL/min (ref >60)
GFR calc non Af Amer: >60 mL/min (ref >60)
Glucose, Bld: 88 mg/dL (ref 70–99)
Potassium: 2.9 mmol/L — ABNORMAL LOW (ref 3.5–5.1)
Sodium: 142 mmol/L (ref 135–145)
Total Bilirubin: 0.2 mg/dL — ABNORMAL LOW (ref 0.3–1.2)
Total Protein: 6.4 g/dL — ABNORMAL LOW (ref 6.5–8.1)

## 2019-02-07 LAB — VALPROIC ACID LEVEL: Valproic Acid Lvl: 37 ug/mL — ABNORMAL LOW (ref 50.0–100.0)

## 2019-02-07 LAB — MAGNESIUM: Magnesium: 2.2 mg/dL (ref 1.7–2.4)

## 2019-02-07 LAB — PHENYTOIN LEVEL, TOTAL: Phenytoin Lvl: 3.1 ug/mL — ABNORMAL LOW (ref 10.0–20.0)

## 2019-02-07 MED ORDER — DIVALPROEX SODIUM 500 MG PO DR TAB
750.0000 mg | DELAYED_RELEASE_TABLET | Freq: Once | ORAL | Status: AC
  Administered 2019-02-08: 750 mg via ORAL
  Filled 2019-02-07: qty 1

## 2019-02-07 MED ORDER — PHENYTOIN SODIUM EXTENDED 100 MG PO CAPS
200.0000 mg | ORAL_CAPSULE | Freq: Once | ORAL | Status: AC
  Administered 2019-02-08: 200 mg via ORAL
  Filled 2019-02-07: qty 2

## 2019-02-07 MED ORDER — POTASSIUM CHLORIDE ER 10 MEQ PO TBCR: 10.0000 meq | EXTENDED_RELEASE_TABLET | Freq: Every day | ORAL | 0 refills | Status: DC

## 2019-02-07 MED ORDER — POTASSIUM CHLORIDE 10 MEQ/100ML IV SOLN
10.0000 meq | INTRAVENOUS | Status: AC
  Administered 2019-02-07 (×2): 10 meq via INTRAVENOUS
  Filled 2019-02-07 (×2): qty 100

## 2019-02-07 MED ORDER — POTASSIUM CHLORIDE CRYS ER 20 MEQ PO TBCR
40.0000 meq | EXTENDED_RELEASE_TABLET | Freq: Once | ORAL | Status: AC
  Administered 2019-02-07: 40 meq via ORAL
  Filled 2019-02-07: qty 2

## 2019-02-07 NOTE — ED Notes (Signed)
Pt out of bed, to room door Pt ambulatory to the bathroom with 1 person assist

## 2019-02-07 NOTE — ED Notes (Signed)
Per Main lab, ok to add on Magnesium to labs drawn at 1955 hrs

## 2019-02-07 NOTE — ED Notes (Signed)
Pt up and out of bed again at the door Pt ambulatory to the bathroom with 1 person assist Pt pulled out IV by mistake Pt again encouraged to use the call bell and ask for assistance when needing to get out of bed

## 2019-02-07 NOTE — ED Notes (Signed)
Call Sidney Ace first with any information and she Jamieon update her sister.

## 2019-02-07 NOTE — ED Notes (Signed)
Pt transported to CT ?

## 2019-02-07 NOTE — ED Notes (Signed)
Talked to Daughter Mariann Laster, @ [phone]. She Jsiah be coming to get pt upon discharge.

## 2019-02-07 NOTE — ED Triage Notes (Signed)
Pt presents with family with c/o fall. Pt has a hx of seizures and had one today, falling and hitting his head on the table. Pt does have a knot on the front of his head. Family also reporting that his feet are very swollen and pt reports that the pain in his feet is 10/10.

## 2019-02-07 NOTE — ED Provider Notes (Signed)
[facility]-EMERGENCY DEPT Provider Note   CSN: [id number] Arrival date & time: 02/07/19  1743    History   Chief Complaint Chief Complaint  Patient presents with   Fall    HPI Samuel Kirk is a 84 y.o. male.     The history is provided by the patient and a relative. No language interpreter was used.  Fall   Samuel Kirk is a 84 y.o. male who presents to the Emergency Department complaining of leg swelling, fall. He presents to the emergency department accompanied by his daughter for evaluation following a seizure with head injury as well as leg swelling. Level V caveat due to dementia. History is provided by the daughter. She states that he has a history of seizure disorder and has occasional seizures, unclear when his last seizure was. He has been living with her for the last three weeks. He had a seizure today that lasted about 10 minutes. He was sitting in a chair playing cards when he fell forward to the ground and had repetitive arm movement. She states that he has a hematoma to his head following the event. No incontinence. He has been experiencing three days of progressive bilateral lower extremity edema. He complains of pain in his feet. He denies any head, chest, or abdominal pain. No reports of fevers, vomiting, diarrhea, dysuria. He is compliant with his home medications. No alcohol use.  Past Medical History:  Diagnosis Date   Dementia (HCC)     There are no active problems to display for this patient.   History reviewed. No pertinent surgical history.      Home Medications    Prior to Admission medications   Medication Sig Start Date End Date Taking? Authorizing Provider  divalproex (DEPAKOTE ER) 250 MG 24 hr tablet Take 750 mg by mouth at bedtime.     [provider]  donepezil (ARICEPT) 5 MG tablet Take 5 mg by mouth every morning.     [provider]  phenytoin (DILANTIN) 100 MG ER capsule Take 1 capsule (100 mg total)  by mouth 2 (two) times daily. 02/27/13   Kirichenko, Tatyana, PA-C  potassium chloride (K-DUR) 10 MEQ tablet Take 1 tablet (10 mEq total) by mouth daily. 02/07/19   Tilden Fossaees, Esmeralda Malay, MD    Family History History reviewed. No pertinent family history.  Social History Social History   Tobacco Use   Smoking status: Never Smoker   Smokeless tobacco: Current User  Substance Use Topics   Alcohol use: No   Drug use: No     Allergies   Patient has no known allergies.   Review of Systems Review of Systems  All other systems reviewed and are negative.    Physical Exam Updated Vital Signs BP (!) 141/67    Pulse (!) 43    Temp 98.6 F (37 C) (Oral)    Resp 14    SpO2 100%   Physical Exam Vitals signs and nursing note reviewed.  Constitutional:      Appearance: He is well-developed.  HENT:     Head: Normocephalic.     Comments: Hematoma to right forehead Neck:     Comments: No midline cervical tenderness Cardiovascular:     Rate and Rhythm: Normal rate and regular rhythm.     Heart sounds: No murmur.  Pulmonary:     Effort: Pulmonary effort is normal. No respiratory distress.     Breath sounds: Normal breath sounds.  Abdominal:     Palpations:  Abdomen is soft.     Tenderness: There is no abdominal tenderness. There is no guarding or rebound.  Musculoskeletal:        General: No tenderness.     Comments: Absent palpable DP pulses. Dopplerable DP pulses bilaterally. 3+ pitting edema to bilateral feet and legs from the knee down.  Skin:    General: Skin is warm and dry.  Neurological:     Mental Status: He is alert.     Comments: Hard of hearing. Oriented to place. Disoriented to time in recent events. No asymmetry of facial movements. Visual fields are grossly intact. 4+ out of five strength and bilateral upper extremities, 4/5 strength and bilateral lower extremities.  Psychiatric:        Behavior: Behavior normal.      ED Treatments / Results  Labs (all labs  ordered are listed, but only abnormal results are displayed) Labs Reviewed  COMPREHENSIVE METABOLIC PANEL - Abnormal; Notable for the following components:      Result Value   Potassium 2.9 (*)    Calcium 8.5 (*)    Total Protein 6.4 (*)    Albumin 3.2 (*)    AST 14 (*)    Total Bilirubin 0.2 (*)    All other components within normal limits  CBC WITH DIFFERENTIAL/PLATELET - Abnormal; Notable for the following components:   WBC 2.9 (*)    RBC 3.27 (*)    Hemoglobin 10.9 (*)    HCT 33.4 (*)    MCV 102.1 (*)    Neutro Abs 1.5 (*)    All other components within normal limits  VALPROIC ACID LEVEL - Abnormal; Notable for the following components:   Valproic Acid Lvl 37 (*)    All other components within normal limits  PHENYTOIN LEVEL, TOTAL - Abnormal; Notable for the following components:   Phenytoin Lvl 3.1 (*)    All other components within normal limits  URINALYSIS, ROUTINE W REFLEX MICROSCOPIC  MAGNESIUM  PHENYTOIN LEVEL, FREE AND TOTAL    EKG EKG Interpretation  Date/Time:  Saturday February 07 2019 18:40:53 EDT Ventricular Rate:  52 PR Interval:    QRS Duration: 94 QT Interval:  459 QTC Calculation: 427 R Axis:   14 Text Interpretation:  Sinus rhythm Probable left atrial enlargement Abnormal R-wave progression, early transition Confirmed by Tilden Fossaees, Theda Payer [phone]([id number]) on 02/07/2019 7:23:16 PM   Radiology Dg Chest 2 View  Result Date: 02/07/2019 CLINICAL DATA:  84 year old male with seizure. Progressive leg edema. Fall and trauma to the head. EXAM: CHEST - 2 VIEW COMPARISON:  Chest radiograph dated 02/27/2013 FINDINGS: There is hyperexpansion of the lungs, likely related to underlying air trapping or COPD. No focal consolidation, pleural effusion, or pneumothorax. The cardiac silhouette is within normal limits. The aorta is tortuous. Osteopenia. No acute osseous pathology. IMPRESSION: No acute cardiopulmonary process. Electronically Signed   By: Elgie CollardArash  Radparvar M.D.   On:  02/07/2019 19:47   Ct Head Wo Contrast  Result Date: 02/07/2019 CLINICAL DATA:  Seizure today.  Fall.  Pain. EXAM: CT HEAD WITHOUT CONTRAST CT CERVICAL SPINE WITHOUT CONTRAST TECHNIQUE: Multidetector CT imaging of the head and cervical spine was performed following the standard protocol without intravenous contrast. Multiplanar CT image reconstructions of the cervical spine were also generated. COMPARISON:  February 27, 2013 FINDINGS: CT HEAD FINDINGS Brain: No subdural, epidural, or subarachnoid hemorrhage. Ventricles and sulci are prominent but stable. Cerebellum, brainstem, and basal cisterns are normal. No mass effect or midline shift. No evidence of  acute cortical ischemia or infarct. Vascular: Calcified atherosclerosis is seen in the intracranial carotids. Skull: Normal. Negative for fracture or focal lesion. Sinuses/Orbits: No acute finding. Other: There is soft tissue swelling over the right forehead region. Extracranial soft tissues are otherwise normal. CT CERVICAL SPINE FINDINGS Alignment: There is scoliotic curvature in the lower cervical spine, apex to the left, unchanged. Mild retrolisthesis of C3 versus C4 is stable. No other malalignment. Skull base and vertebrae: No fracture or dislocation. Soft tissues and spinal canal: No prevertebral fluid or swelling. No visible canal hematoma. Disc levels:  Multilevel degenerative changes. Upper chest: Negative. Other: No other abnormalities. IMPRESSION: 1. Soft tissue swelling over the right forehead. 2. No acute intracranial abnormalities. 3. No fracture or traumatic malalignment in the cervical spine. Severe degenerative changes. Electronically Signed   By: Dorise Bullion III M.D   On: 02/07/2019 19:47   Ct Cervical Spine Wo Contrast  Result Date: 02/07/2019 CLINICAL DATA:  Seizure today.  Fall.  Pain. EXAM: CT HEAD WITHOUT CONTRAST CT CERVICAL SPINE WITHOUT CONTRAST TECHNIQUE: Multidetector CT imaging of the head and cervical spine was performed  following the standard protocol without intravenous contrast. Multiplanar CT image reconstructions of the cervical spine were also generated. COMPARISON:  February 27, 2013 FINDINGS: CT HEAD FINDINGS Brain: No subdural, epidural, or subarachnoid hemorrhage. Ventricles and sulci are prominent but stable. Cerebellum, brainstem, and basal cisterns are normal. No mass effect or midline shift. No evidence of acute cortical ischemia or infarct. Vascular: Calcified atherosclerosis is seen in the intracranial carotids. Skull: Normal. Negative for fracture or focal lesion. Sinuses/Orbits: No acute finding. Other: There is soft tissue swelling over the right forehead region. Extracranial soft tissues are otherwise normal. CT CERVICAL SPINE FINDINGS Alignment: There is scoliotic curvature in the lower cervical spine, apex to the left, unchanged. Mild retrolisthesis of C3 versus C4 is stable. No other malalignment. Skull base and vertebrae: No fracture or dislocation. Soft tissues and spinal canal: No prevertebral fluid or swelling. No visible canal hematoma. Disc levels:  Multilevel degenerative changes. Upper chest: Negative. Other: No other abnormalities. IMPRESSION: 1. Soft tissue swelling over the right forehead. 2. No acute intracranial abnormalities. 3. No fracture or traumatic malalignment in the cervical spine. Severe degenerative changes. Electronically Signed   By: Dorise Bullion III M.D   On: 02/07/2019 19:47    Procedures Procedures (including critical care time)  Medications Ordered in ED Medications  potassium chloride SA (K-DUR) CR tablet 40 mEq (40 mEq Oral Given 02/07/19 2101)  potassium chloride 10 mEq in 100 mL IVPB (0 mEq Intravenous Stopped 02/07/19 2243)  divalproex (DEPAKOTE) DR tablet 750 mg (750 mg Oral Given 02/08/19 0002)  phenytoin (DILANTIN) ER capsule 200 mg (200 mg Oral Given 02/08/19 0002)     Initial Impression / Assessment and Plan / ED Course  I have reviewed the triage vital signs and  the nursing notes.  Pertinent labs & imaging results that were available during my care of the patient were reviewed by me and considered in my medical decision making (see chart for details).        Patient with history of Alzheimer's dementia, seizure disorder here for evaluation following seizure, lower extremity edema. He is mildly confused but pleasant on examination. EKG with sinus bradycardia, no significant change when compared to priors. Labs are significant for hypokalemia, this was replaced. Labs are also significant for sub therapeutic Dilantin and Depakote levels. Discussed patient's presentation and lab findings with neurologist on-call. Given his complicated  medical regimen it is recommended that he follows up with neurology. Referral placed for neurologic follow-up. The patient is able to ambulate without difficulty in the department. Compression stockings were placed for his edema. Given his hypokalemia Killian not initiate diuretic. There is no evidence of cellulitis or acute infectious process. Presentation is not consistent with congestive heart failure, DVT. Discussed with daughter home care for seizures, leg edema as well as hypokalemia. Discussed outpatient follow-up with PCP and neurology as well as return precautions.  Final Clinical Impressions(s) / ED Diagnoses   Final diagnoses:  Seizure (HCC)  Hypokalemia  Lower extremity edema    ED Discharge Orders         Ordered    Ambulatory referral to Neurology    Comments: An appointment is requested in approximately: 2 weeks Seizure disorder   02/07/19 2355    potassium chloride (K-DUR) 10 MEQ tablet  Daily     02/07/19 2357           Tilden Fossaees, Evadean Sproule, MD 02/08/19 [phone]

## 2019-02-08 DIAGNOSIS — S0083XA Contusion of other part of head, initial encounter: Secondary | ICD-10-CM | POA: Diagnosis not present

## 2019-02-08 NOTE — ED Notes (Signed)
Discharge instructions were reviewed with daughter. All questions were answered, and verbalized understanding.

## 2019-02-10 LAB — PHENYTOIN LEVEL, FREE AND TOTAL
Phenytoin, Free: NOT DETECTED ug/mL (ref 1.0–2.0)
Phenytoin, Total: 3.2 ug/mL — ABNORMAL LOW (ref 10.0–20.0)

## 2019-02-19 ENCOUNTER — Telehealth: Payer: Self-pay

## 2019-02-19 NOTE — Telephone Encounter (Signed)
Pt's 02/23/19 appt has been moved to 02/25/19 at 9 am.

## 2019-02-23 ENCOUNTER — Ambulatory Visit: Payer: Medicare Other | Admitting: Neurology

## 2019-02-25 ENCOUNTER — Ambulatory Visit (INDEPENDENT_AMBULATORY_CARE_PROVIDER_SITE_OTHER): Payer: Medicare Other | Admitting: Neurology

## 2019-02-25 ENCOUNTER — Encounter: Payer: Self-pay | Admitting: Neurology

## 2019-02-25 ENCOUNTER — Other Ambulatory Visit: Payer: Self-pay

## 2019-02-25 DIAGNOSIS — R0989 Other specified symptoms and signs involving the circulatory and respiratory systems: Secondary | ICD-10-CM

## 2019-02-25 DIAGNOSIS — G301 Alzheimer's disease with late onset: Secondary | ICD-10-CM

## 2019-02-25 DIAGNOSIS — R569 Unspecified convulsions: Secondary | ICD-10-CM

## 2019-02-25 DIAGNOSIS — F0280 Dementia in other diseases classified elsewhere without behavioral disturbance: Secondary | ICD-10-CM

## 2019-02-25 DIAGNOSIS — R269 Unspecified abnormalities of gait and mobility: Secondary | ICD-10-CM | POA: Diagnosis not present

## 2019-02-25 DIAGNOSIS — F0390 Unspecified dementia without behavioral disturbance: Secondary | ICD-10-CM | POA: Insufficient documentation

## 2019-02-25 HISTORY — DX: Other specified symptoms and signs involving the circulatory and respiratory systems: R09.89

## 2019-02-25 HISTORY — DX: Unspecified abnormalities of gait and mobility: R26.9

## 2019-02-25 MED ORDER — DIVALPROEX SODIUM 500 MG PO DR TAB: DELAYED_RELEASE_TABLET | ORAL | 3 refills | Status: DC

## 2019-02-25 MED ORDER — PHENYTOIN SODIUM EXTENDED 100 MG PO CAPS: 100.0000 mg | ORAL_CAPSULE | Freq: Two times a day (BID) | ORAL | 3 refills | Status: DC

## 2019-02-25 NOTE — Patient Instructions (Signed)
Increase the Valproic acid to 500 mg in the morning and 1000 mg in the evening.  Depakote (valproic acid) is a seizure medication that also has an FDA approval for migraine headache. The most common potential side effects of this medication include weight gain, tremor, or possible stomach upset. This medication can potentially cause liver problems. If confusion is noted on this medication, contact our office immediately.

## 2019-02-25 NOTE — Progress Notes (Signed)
Reason for visit: Dementia, seizures, gait disturbance  Referring physician: [place]  Samuel Kirk is a 84 y.o. male  History of present illness:  Samuel Kirk is an 84 year old left-handed black male with a history of dementia and seizures.  Samuel Kirk has been residing in extended care facilities over Samuel last several years, more recently Samuel Kirk is living with his daughter who is present with him today.  Samuel Kirk apparently has a lifelong history of seizures, Samuel Kirk has not been managed through neurology anytime recently, Samuel Kirk is on low doses of Dilantin and Depakote.  His last seizure was about a year ago but then Samuel Kirk presented to Samuel emergency room on 07 February 2019 with another generalized seizure event.  Samuel blood levels of Dilantin and Depakote were low at that time.  Samuel Kirk gets 100 mg twice daily of Dilantin and 750 mg of Depakote at nighttime.  Samuel Kirk is on Aricept for memory 5 mg daily.  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  Samuel Kirk has a significant dementia.  Samuel Kirk does not operate a motor vehicle, Samuel Kirk Kerby require assistance with keeping up with medications and appointments.  Samuel Kirk requires supervision with bathing and dressing.  Samuel Kirk comes to this office for further evaluation.  Past Medical History:  Diagnosis Date  . Dementia (HCC)   . Seizures (HCC)     Past Surgical History:  Procedure Laterality Date  . FOOT SURGERY      No Kirk history on file.  Social history:  reports that Samuel Kirk has never smoked. Samuel Kirk uses smokeless tobacco. Samuel Kirk reports that Samuel Kirk does not drink alcohol or use drugs.  Medications:  Prior to Admission medications   Medication Sig Start Date End Date Taking? Authorizing Provider  divalproex (DEPAKOTE ER) 250 MG 24 hr tablet Take 750 mg by mouth at bedtime.     [provider]  donepezil (ARICEPT) 5 MG tablet Take 5 mg by mouth every morning.     [provider]  phenytoin (DILANTIN)  100 MG ER capsule Take 1 capsule (100 mg total) by mouth 2 (two) times daily. 02/27/13   Kirichenko, Tatyana, PA-C  potassium chloride (K-DUR) 10 MEQ tablet Take 1 tablet (10 mEq total) by mouth daily. 02/07/19   Tilden Fossaees, Elizabeth, MD     No Known Allergies  ROS:  Out of a complete 14 system review of symptoms, Samuel Kirk complains only of Samuel following symptoms, and all other reviewed systems are negative.  Memory problems Walking difficulty  Temperature 98.2 F (36.8 C), height 6' (1.829 m), weight 124 lb (56.2 kg).  Physical Exam  General: Samuel Kirk is alert and cooperative at Samuel time of Samuel examination.  Samuel Kirk is minimally verbal.  Eyes: Pupils are equal, round, and reactive to light. Discs are flat bilaterally.  Neck: Samuel neck is supple, a left carotid bruit is noted.  Respiratory: Samuel respiratory examination is clear.  Cardiovascular: Samuel cardiovascular examination reveals a regular rate and rhythm, a grade I/VI systolic ejection murmur is noted.  Skin: Extremities are with 2+ edema below Samuel knees bilaterally.  Neurologic Exam  Mental status: Samuel Kirk is alert and oriented x 2 at Samuel time of Samuel examination (not oriented to date). Samuel Mini-Mental Status Examination done today shows a total score of 13/30.  Samuel Kirk is able to name 6 animals in 1 minute.  Cranial nerves: Facial symmetry is present. There is good  sensation of Samuel face to pinprick and soft touch bilaterally. Samuel strength of Samuel facial muscles and Samuel muscles to head turning and shoulder shrug are normal bilaterally. Speech is well enunciated, no aphasia or dysarthria is noted. Extraocular movements are full. Visual fields are full. Samuel tongue is midline, and Samuel Kirk has symmetric elevation of Samuel soft palate. No obvious hearing deficits are noted.  Motor: Samuel motor testing reveals 5 over 5 strength of all 4 extremities. Good symmetric motor tone is noted throughout.  Sensory: Sensory testing is intact  to pinprick, soft touch, vibration sensation, and position sense on all 4 extremities. No evidence of extinction is noted.  Coordination: Cerebellar testing reveals good finger-nose-finger and heel-to-shin bilaterally.  Samuel Kirk does have some apraxia with use of Samuel extremities.  Gait and station: Gait is wide-based, unsteady.  Tandem gait was not attempted.  Romberg is negative but is unsteady.  No drift is seen.  Reflexes: Deep tendon reflexes are symmetric, but are depressed bilaterally. Toes are downgoing bilaterally.   CT head and cervical 02/07/19:  IMPRESSION: 1. Soft tissue swelling over Samuel right forehead. 2. No acute intracranial abnormalities. 3. No fracture or traumatic malalignment in Samuel cervical spine. Severe degenerative changes.  * CT scan images were reviewed online. I agree with Samuel written report.    Assessment/Plan:  1.  Seizures with recent recurrence  2.  Dementia  3.  Gait disorder  Samuel Kirk has had subtherapeutic levels of Depakote and Dilantin on his last ER visit.  Samuel Kirk is on low-dose of both of these medications.  Samuel Kirk Sylus be increased on Samuel Depakote dosing to 500 mg in Samuel morning and 1000 mg in Samuel evening.  Samuel Kirk Zachari come back here in 2 months, we Joenathan recheck blood work at that time, this Derren include a work-up for his dementia and gait instability.  Samuel Kirk does have a left carotid bruit on clinical examination.  Samuel Kirk Syd contact me for any other concerns.  Samuel Kirk have requested to change with his primary care physician.  Jill Alexanders MD 02/25/2019 9:10 AM  [facility] [address] [place], [place] [address]  Phone [phone] Fax [phone]

## 2019-04-26 NOTE — Progress Notes (Signed)
PATIENT: Samuel Kirk DOB: 1935-06-22  REASON FOR VISIT: follow up HISTORY FROM: patient  HISTORY OF PRESENT ILLNESS: Today 04/27/19  Samuel Kirk is an 84 year old male with history of dementia and seizures.  He is now residing with his daughter, Samuel Kirk.  She indicates that since he has been taking Dilantin and Depakote he has been doing well.   His daughter handles and administers his medications.  He has not had recurrent seizure. He has remained on Aricept 5 mg at bedtime.  His memory has been stable.  He has good and bad days.  He does have an aide who comes to the home every day from 10-2.  He is able to dress himself, get in the tub.  He requires assistance with bathing.  She indicates he eats 3 meals a day and has a good appetite.  He has not had any recent falls, but he is now using a quad cane.  He is able to go to the bathroom independently.  He does not operate a car.  He is not left alone during the day.  She denies any behavioral problems. He sees Samuel Kirk for his primary doctor.  He presents today for follow-up accompanied by his daughter, Samuel Kirk.  They deny any new problems or concerns.  HISTORY 02/25/2019 Samuel Kirk: Samuel Kirk is an 84 year old left-handed black male with a history of dementia and seizures.  He has been residing in extended care facilities over the last several years, more recently he is living with his daughter who is present with him today.  The patient apparently has a lifelong history of seizures, he has not been managed through neurology anytime recently, he is on low doses of Dilantin and Depakote.  His last seizure was about a year ago but then he presented to the emergency room on 07 February 2019 with another generalized seizure event.  The blood levels of Dilantin and Depakote were low at that time.  The patient gets 100 mg twice daily of Dilantin and 750 mg of Depakote at nighttime.  He is on Aricept for memory 5 mg daily.  The patient has a gait disorder, uses  a quad cane for ambulation, the family reports no recent falls.  The patient has a significant dementia.  He does not operate a motor vehicle, he Finnlee require assistance with keeping up with medications and appointments.  He requires supervision with bathing and dressing.  The patient comes to this office for further evaluation.   REVIEW OF SYSTEMS: Out of a complete 14 system review of symptoms, the patient complains only of the following symptoms, and all other reviewed systems are negative.  Seizures, memory loss  ALLERGIES: No Known Allergies  HOME MEDICATIONS: Outpatient Medications Prior to Visit  Medication Sig Dispense Refill  . divalproex (DEPAKOTE) 500 MG DR tablet One tablet in the morning and 2 in the evening 90 tablet 3  . donepezil (ARICEPT) 5 MG tablet Take 5 mg by mouth every morning.     . phenytoin (DILANTIN) 100 MG ER capsule Take 1 capsule (100 mg total) by mouth 2 (two) times daily. 60 capsule 3  . potassium chloride (K-DUR) 10 MEQ tablet Take 1 tablet (10 mEq total) by mouth daily. 5 tablet 0   No facility-administered medications prior to visit.     PAST MEDICAL HISTORY: Past Medical History:  Diagnosis Date  . Dementia (HCC)   . Gait abnormality 02/25/2019  . Left carotid bruit 02/25/2019  . Seizures (HCC)  PAST SURGICAL HISTORY: Past Surgical History:  Procedure Laterality Date  . FOOT SURGERY      FAMILY HISTORY: Family History  Problem Relation Age of Onset  . Dementia Mother        died at age 82  . Other Father        unsure of history - died at age 82    SOCIAL HISTORY: Social History   Socioeconomic History  . Marital status: Married    Spouse name: Not on file  . Number of children: 2  . Years of education: 7th grade  . Highest education level: Not on file  Occupational History  . Occupation: Retired  Engineer, productionocial Needs  . Financial resource strain: Not on file  . Food insecurity    Worry: Not on file    Inability: Not on file  .  Transportation needs    Medical: Not on file    Non-medical: Not on file  Tobacco Use  . Smoking status: Never Smoker  . Smokeless tobacco: Current User    Types: Chew  Substance and Sexual Activity  . Alcohol use: No  . Drug use: No  . Sexual activity: Not on file  Lifestyle  . Physical activity    Days per week: Not on file    Minutes per session: Not on file  . Stress: Not on file  Relationships  . Social Musicianconnections    Talks on phone: Not on file    Gets together: Not on file    Attends religious service: Not on file    Active member of club or organization: Not on file    Attends meetings of clubs or organizations: Not on file    Relationship status: Not on file  . Intimate partner violence    Fear of current or ex partner: Not on file    Emotionally abused: Not on file    Physically abused: Not on file    Forced sexual activity: Not on file  Other Topics Concern  . Not on file  Social History Narrative   Lives at home with his daughter.   Left-handed.   No daily caffeine use.            PHYSICAL EXAM  Vitals:   04/27/19 0951  BP: 113/63  Pulse: (!) 46  Temp: 98.2 F (36.8 C)  Weight: 116 lb 6.4 oz (52.8 kg)  Height: 6' (1.829 m)   Body mass index is 15.79 kg/m.  Generalized: Well developed, in no acute distress  MMSE - Mini Mental State Exam 04/27/2019 02/25/2019  Orientation to time 0 1  Orientation to Place 3 4  Registration 3 3  Attention/ Calculation 0 0  Recall 0 0  Language- name 2 objects 2 2  Language- repeat 1 0  Language- follow 3 step command 3 3  Language- read & follow direction 1 0  Write a sentence 0 0  Copy design 1 0  Copy design-comments Seems very confused -  Total score 14 13    Neurological examination  Mentation: Alert, oriented to city, state, birthday, history is provided by daughter. Follows commands, mild difficulty understanding exam commands, is hard of hearing, speech is clear Cranial nerve II-XII: Pupils were  equal round reactive to light. Extraocular movements were full, visual field were full on confrontational test. Facial sensation and strength were normal.  Head turning and shoulder shrug  were normal and symmetric. Motor: The motor testing reveals 5 over 5 strength of all 4  extremities. Good symmetric motor tone is noted throughout.  Sensory: Sensory testing is intact to soft touch on all 4 extremities. No evidence of extinction is noted.  Coordination: Cerebellar testing reveals good finger-nose-finger and heel-to-shin bilaterally.  Gait and station: Gait is wide-based, using a quad cane, mildly unsteady, Romberg was not attempted Reflexes: Deep tendon reflexes are symmetric  DIAGNOSTIC DATA (LABS, IMAGING, TESTING) - I reviewed patient records, labs, notes, testing and imaging myself where available.  Lab Results  Component Value Date   WBC 2.9 (L) 02/07/2019   HGB 10.9 (L) 02/07/2019   HCT 33.4 (L) 02/07/2019   MCV 102.1 (H) 02/07/2019   PLT 167 02/07/2019      Component Value Date/Time   NA 142 02/07/2019 1955   K 2.9 (L) 02/07/2019 1955   CL 105 02/07/2019 1955   CO2 29 02/07/2019 1955   GLUCOSE 88 02/07/2019 1955   BUN 8 02/07/2019 1955   CREATININE 0.70 02/07/2019 1955   CALCIUM 8.5 (L) 02/07/2019 1955   PROT 6.4 (L) 02/07/2019 1955   ALBUMIN 3.2 (L) 02/07/2019 1955   AST 14 (L) 02/07/2019 1955   ALT 8 02/07/2019 1955   ALKPHOS 83 02/07/2019 1955   BILITOT 0.2 (L) 02/07/2019 1955   GFRNONAA >60 02/07/2019 1955   GFRAA >60 02/07/2019 1955   No results found for: CHOL, HDL, LDLCALC, LDLDIRECT, TRIG, CHOLHDL No results found for: HGBA1C Lab Results  Component Value Date   VITAMINB12 450 12/16/2009    ASSESSMENT AND PLAN 84 y.o. year old male  has a past medical history of Dementia ([place]), Gait abnormality (02/25/2019), Left carotid bruit (02/25/2019), and Seizures ([place]). here with:  1.  Seizures 2.  Dementia 3.  Gait disorder  He has not had recurrent seizures  since last seen.  He Lannie remain on Dilantin and Depakote.  I Jaekwon check routine lab work today along with lab work for memory work-up and gait instability.  Since August, he has lost 8 pounds.  He Yaden remain on Aricept 5 mg daily, we may need to discontinue this medication in the future due to weight loss. The Aricept is being filled through his primary doctor.  His memory score was 14/30 today.  We Sevin start Namenda titration pack.  He had a CT scan of the brain 02/07/2019 that did not show any acute abnormality.  He Chesley follow-up in 6 months or sooner if needed. I have suggested he follow with his primary doctor on a routine basis. I did advise if symptoms worsen or if he develops any new symptoms he should let us know.  For lab work I ordered: CBC, CMP, dilantin, depakote, B12, sed rate, RPR, copper, TSH  I spent 25 minutes with the patient. 50% of this time was spent discussing his plan of care.  Butler Denmark, AGNP-C, DNP 04/27/2019, 9:59 AM [facility] [address] [place], [place] [address] [phone]

## 2019-04-27 ENCOUNTER — Ambulatory Visit (INDEPENDENT_AMBULATORY_CARE_PROVIDER_SITE_OTHER): Payer: Medicare Other | Admitting: Neurology

## 2019-04-27 ENCOUNTER — Other Ambulatory Visit: Payer: Self-pay

## 2019-04-27 ENCOUNTER — Encounter: Payer: Self-pay | Admitting: Neurology

## 2019-04-27 VITALS — BP 113/63 | HR 46 | Temp 98.2°F | Ht 72.0 in | Wt 116.4 lb

## 2019-04-27 DIAGNOSIS — R569 Unspecified convulsions: Secondary | ICD-10-CM

## 2019-04-27 DIAGNOSIS — F0280 Dementia in other diseases classified elsewhere without behavioral disturbance: Secondary | ICD-10-CM | POA: Diagnosis not present

## 2019-04-27 DIAGNOSIS — G301 Alzheimer's disease with late onset: Secondary | ICD-10-CM | POA: Diagnosis not present

## 2019-04-27 MED ORDER — DIVALPROEX SODIUM 500 MG PO DR TAB: DELAYED_RELEASE_TABLET | ORAL | 6 refills | Status: AC

## 2019-04-27 MED ORDER — MEMANTINE HCL 5 MG PO TABS: ORAL_TABLET | ORAL | 0 refills | Status: DC

## 2019-04-27 MED ORDER — PHENYTOIN SODIUM EXTENDED 100 MG PO CAPS: 100.0000 mg | ORAL_CAPSULE | Freq: Two times a day (BID) | ORAL | 6 refills | Status: DC

## 2019-04-27 NOTE — Progress Notes (Signed)
I have read the note, and I agree with the clinical assessment and plan.  Samuel Kirk   

## 2019-04-27 NOTE — Patient Instructions (Addendum)
1. Continue Dilantin and Depakote  2. We should monitor the use of Aricept overtime, due to weight loss of 8 lbs since August  3. I [place] check lab work today 4. Start Namenda titration, slow increase to 10 mg twice a day. Once your complete the titration pack, call for a new prescription  5. Return in 6 months   Memantine Tablets What is this medicine? MEMANTINE (MEM an teen) is used to treat dementia caused by Alzheimer's disease. This medicine may be used for other purposes; ask your health care provider or pharmacist if you have questions. COMMON BRAND NAME(S): Namenda What should I tell my health care provider before I take this medicine? They need to know if you have any of these conditions:  difficulty passing urine  kidney disease  liver disease  seizures  an unusual or allergic reaction to memantine, other medicines, foods, dyes, or preservatives  pregnant or trying to get pregnant  breast-feeding How should I use this medicine? Take this medicine by mouth with a glass of water. Follow the directions on the prescription label. You may take this medicine with or without food. Take your doses at regular intervals. Do not take your medicine more often than directed. Continue to take your medicine even if you feel better. Do not stop taking except on the advice of your doctor or health care professional. Talk to your pediatrician regarding the use of this medicine in children. Special care may be needed. Overdosage: If you think you have taken too much of this medicine contact a poison control center or emergency room at once. NOTE: This medicine is only for you. Do not share this medicine with others. What if I miss a dose? If you miss a dose, take it as soon as you can. If it is almost time for your next dose, take only that dose. Do not take double or extra doses. If you do not take your medicine for several days, contact your health care provider. Your dose may need to be  changed. What may interact with this medicine?  acetazolamide  amantadine  cimetidine  dextromethorphan  dofetilide  hydrochlorothiazide  ketamine  metformin  methazolamide  quinidine  ranitidine  sodium bicarbonate  triamterene This list may not describe all possible interactions. Give your health care provider a list of all the medicines, herbs, non-prescription drugs, or dietary supplements you use. Also tell them if you smoke, drink alcohol, or use illegal drugs. Some items may interact with your medicine. What should I watch for while using this medicine? Visit your doctor or health care professional for regular checks on your progress. Check with your doctor or health care professional if there is no improvement in your symptoms or if they get worse. You may get drowsy or dizzy. Do not drive, use machinery, or do anything that needs mental alertness until you know how this drug affects you. Do not stand or sit up quickly, especially if you are an older patient. This reduces the risk of dizzy or fainting spells. Alcohol can make you more drowsy and dizzy. Avoid alcoholic drinks. What side effects may I notice from receiving this medicine? Side effects that you should report to your doctor or health care professional as soon as possible:  allergic reactions like skin rash, itching or hives, swelling of the face, lips, or tongue  agitation or a feeling of restlessness  depressed mood  dizziness  hallucinations  redness, blistering, peeling or loosening of the skin, including inside  the mouth  seizures  vomiting Side effects that usually do not require medical attention (report to your doctor or health care professional if they continue or are bothersome):  constipation  diarrhea  headache  nausea  trouble sleeping This list may not describe all possible side effects. Call your doctor for medical advice about side effects. You may report side effects to  FDA at 1-800-FDA-1088. Where should I keep my medicine? Keep out of the reach of children. Store at room temperature between 15 degrees and 30 degrees C (59 degrees and 86 degrees F). Throw away any unused medicine after the expiration date. NOTE: This sheet is a summary. It may not cover all possible information. If you have questions about this medicine, talk to your doctor, pharmacist, or health care provider.  2020 Elsevier/Gold Standard (2013-04-06 14:10:42)

## 2019-04-29 ENCOUNTER — Telehealth: Payer: Self-pay | Admitting: Neurology

## 2019-04-29 LAB — COMPREHENSIVE METABOLIC PANEL
ALT: 3 IU/L (ref 0–44)
AST: 8 IU/L (ref 0–40)
Albumin/Globulin Ratio: 1.1 — ABNORMAL LOW (ref 1.2–2.2)
Albumin: 3.6 g/dL (ref 3.6–4.6)
Alkaline Phosphatase: 132 IU/L — ABNORMAL HIGH (ref 39–117)
BUN/Creatinine Ratio: 12 (ref 10–24)
BUN: 10 mg/dL (ref 8–27)
Bilirubin Total: 0.2 mg/dL (ref 0.0–1.2)
CO2: 28 mmol/L (ref 20–29)
Calcium: 8.7 mg/dL (ref 8.6–10.2)
Chloride: 100 mmol/L (ref 96–106)
Creatinine, Ser: 0.85 mg/dL (ref 0.76–1.27)
GFR calc Af Amer: 94 mL/min/{1.73_m2} (ref >59)
GFR calc non Af Amer: 82 mL/min/{1.73_m2} (ref >59)
Globulin, Total: 3.3 g/dL (ref 1.5–4.5)
Glucose: 83 mg/dL (ref 65–99)
Potassium: 3.8 mmol/L (ref 3.5–5.2)
Sodium: 139 mmol/L (ref 134–144)
Total Protein: 6.9 g/dL (ref 6.0–8.5)

## 2019-04-29 LAB — VITAMIN B12: Vitamin B-12: 592 pg/mL (ref 232–1245)

## 2019-04-29 LAB — CBC WITH DIFFERENTIAL/PLATELET
Basophils Absolute: 0 10*3/uL (ref 0.0–0.2)
Basos: 0 %
EOS (ABSOLUTE): 0 10*3/uL (ref 0.0–0.4)
Eos: 1 %
Hematocrit: 33.9 % — ABNORMAL LOW (ref 37.5–51.0)
Hemoglobin: 11.5 g/dL — ABNORMAL LOW (ref 13.0–17.7)
Immature Grans (Abs): 0 10*3/uL (ref 0.0–0.1)
Immature Granulocytes: 0 %
Lymphocytes Absolute: 1.1 10*3/uL (ref 0.7–3.1)
Lymphs: 48 %
MCH: 33.3 pg — ABNORMAL HIGH (ref 26.6–33.0)
MCHC: 33.9 g/dL (ref 31.5–35.7)
MCV: 98 fL — ABNORMAL HIGH (ref 79–97)
Monocytes Absolute: 0.5 10*3/uL (ref 0.1–0.9)
Monocytes: 20 %
Neutrophils Absolute: 0.7 10*3/uL — ABNORMAL LOW (ref 1.4–7.0)
Neutrophils: 31 %
Platelets: 121 10*3/uL — ABNORMAL LOW (ref 150–450)
RBC: 3.45 x10E6/uL — ABNORMAL LOW (ref 4.14–5.80)
RDW: 14.3 % (ref 11.6–15.4)
WBC: 2.3 10*3/uL — CL (ref 3.4–10.8)

## 2019-04-29 LAB — PHENYTOIN LEVEL, TOTAL: Phenytoin (Dilantin), Serum: 5.5 ug/mL — ABNORMAL LOW (ref 10.0–20.0)

## 2019-04-29 LAB — RPR: RPR Ser Ql: NONREACTIVE

## 2019-04-29 LAB — COPPER, SERUM: Copper: 67 ug/dL — ABNORMAL LOW (ref 72–166)

## 2019-04-29 LAB — VALPROIC ACID LEVEL: Valproic Acid Lvl: 64 ug/mL (ref 50–100)

## 2019-04-29 LAB — SEDIMENTATION RATE: Sed Rate: 24 mm/hr (ref 0–30)

## 2019-04-29 LAB — TSH: TSH: 1.65 u[IU]/mL (ref 0.450–4.500)

## 2019-04-29 MED ORDER — LEVETIRACETAM 500 MG PO TABS: ORAL_TABLET | ORAL | 5 refills | Status: DC

## 2019-04-29 NOTE — Telephone Encounter (Signed)
I called the patient and talked to his daughter, Inez Catalina.  Recent lab work shows a low white blood cell count of 2.3, hemoglobin 11.5, platelet 121. I did discuss these results with Dr. Jannifer Franklin.  The pancytopenia may be coming from the Depakote and Dilantin.  At this point, we Kamdon first try to get him off Depakote.  Before discontinuing any medications, he Sohan start Keppra 500 mg twice a day for 2 weeks, then increase to 750 mg twice a day x 2 weeks.  At that point, we Dontario begin to initiate slow titration of Depakote.  He is currently taking Depakote 500 mg in the morning, 1000 mg in the evening.  Once he is on a good stable dose of Keppra, he Kainoa take Depakote 500 mg twice a day for 2 weeks, then take 500 mg at bedtime for 2 weeks.  After that, he Keynan stop the medication.  We Tyree recheck lab work in 6 to 8 weeks, a CBC level, CMP.  We may need to consider discontinuation of Dilantin in the future also.  A random Dilantin level was low at 5.5.  Also, his copper level was mildly low at 67. He should take a daily multivitamin, such as Centrum that contains copper. For now, he Lakshya remain on Keppra, Depakote, Dilantin. I Jahmil place the order for lab work when we began the Depakote taper.

## 2019-05-26 ENCOUNTER — Telehealth: Payer: Self-pay | Admitting: Neurology

## 2019-05-26 NOTE — Telephone Encounter (Signed)
I was unable to get in touch with [facility]. I called Mariann Laster, and asked that she have Inez Catalina contact me at a good time for her so we can discuss Mr. Fester.

## 2019-05-26 NOTE — Telephone Encounter (Signed)
I tried to call his daughter, Inez Catalina. There was no answer. I Izak try to call back.

## 2019-05-27 NOTE — Telephone Encounter (Signed)
I tried to call both #'s listed on file for the patient. There was no answer, mailbox was full. I Tremayne try to call next week.

## 2019-06-01 ENCOUNTER — Telehealth: Payer: Self-pay | Admitting: Neurology

## 2019-06-01 NOTE — Telephone Encounter (Signed)
I called the patient's daughter, Inez Catalina. He is doing well, has not had recurrent seizure. He has been taking Keppra 500 mg BID for 2 weeks. He Tilak start taking 750 mg BID x 2 weeks then we Tykee start to decrease the depakote. I Tymeir contact them in 2 weeks to ensure he is on a good dose of Keppra to start the dose decrease of Depakote.

## 2019-06-10 ENCOUNTER — Other Ambulatory Visit: Payer: Self-pay | Admitting: Neurology

## 2019-06-11 NOTE — Telephone Encounter (Signed)
LMVM for pt daughter, Mariann Laster, that did received request for refill on memantine.  If tolerating ok, can get 10mg  tablets where he Eliot take 1 po bid.  Please call back and let us know.

## 2019-06-15 NOTE — Telephone Encounter (Signed)
Please contact the patient's daughter, Inez Catalina.  Ensure he is taking Keppra 750 mg twice a day.  At this point he should have been taking this for 2 weeks.  If this is the case, have him start to decrease Depakote.  He is currently taking Depakote 500 mg in the morning, 1000 mg in the evening.Start taking Depakote 500 mg twice a day for 2 weeks, then take 500 mg at bedtime for 2 weeks.  Once he is off the medication, I Phuong recheck lab work.  Depending on lab work at that time, we may decrease Dilantin as well. He should remain on Keppra.   Please check about Namenda at that time. I can then fill the maintenance dose.

## 2019-06-15 NOTE — Telephone Encounter (Signed)
Attempted to call daughter, Samuel Kirk. No answer.  Kym call back later.

## 2019-06-16 MED ORDER — MEMANTINE HCL 10 MG PO TABS: 10.0000 mg | ORAL_TABLET | Freq: Two times a day (BID) | ORAL | 5 refills | Status: DC

## 2019-06-16 NOTE — Addendum Note (Signed)
Addended by: Brandon Melnick on: 06/16/2019 03:47 PM   Modules accepted: Orders

## 2019-06-16 NOTE — Telephone Encounter (Signed)
I called Samuel Kirk , then spoke to [name] who was there also.  She stated he was tolerating the memantine 10mg  po bid, Styles change to 10mg  tablets now.  She verbalized understanding.  I then explained to her that if was taking keppra 750mg  po bid and doing well for the last 2 wks, then we can decrease depakote from 500mg  am 1000mg  po pm to 500mg  po bid for 2 wks then 500mg  at night for 2 wks then stop.  Ab repeat lab tests then depending on results may then decrease dilantin.  I felt was too much for her to take down, Bren mail instructions and then they can call back if questions.

## 2019-06-17 NOTE — Telephone Encounter (Signed)
Mailed letter to [facility] [phone] with instructions for depakote, keppra, namenda.

## 2019-06-29 ENCOUNTER — Other Ambulatory Visit: Payer: Self-pay | Admitting: Neurology

## 2019-07-14 ENCOUNTER — Telehealth: Payer: Self-pay | Admitting: Neurology

## 2019-07-14 NOTE — Telephone Encounter (Signed)
Any luck getting in touch with Mr. Rumple family regarding plan of care?

## 2019-07-15 NOTE — Telephone Encounter (Signed)
I LMVM for Ara Kussmaul, daughter Mobile # to return call and let us know how pt is and where he is in medication.

## 2019-07-20 ENCOUNTER — Encounter: Payer: Self-pay | Admitting: *Deleted

## 2019-07-20 NOTE — Telephone Encounter (Signed)
I called wonda.  Was able to speak to her, but she was not incharge of his medications.  She Hoby relay to her Kathie Rhodes to call us back.

## 2019-07-20 NOTE — Telephone Encounter (Signed)
Samuel Kirk returned call and stated patient is doing well on medication

## 2019-07-20 NOTE — Telephone Encounter (Signed)
I called daughter Kathie Rhodes, [place] not set up, then called Wonda, and LMVM for her that we are following up on sz medication recommendations for pt.  Please call back.

## 2019-07-21 NOTE — Telephone Encounter (Signed)
I called and [name] VM not set up.

## 2019-07-22 ENCOUNTER — Encounter: Payer: Self-pay | Admitting: *Deleted

## 2019-07-22 NOTE — Telephone Encounter (Signed)
Mailed letter °

## 2019-07-22 NOTE — Telephone Encounter (Signed)
I called and [name] VM not set up.  I Caidin mail note to call the office about Samuel Kirk's medication management.

## 2019-08-17 ENCOUNTER — Telehealth: Payer: Self-pay | Admitting: Neurology

## 2019-08-17 NOTE — Telephone Encounter (Signed)
Can you try to contact this patient again, I know you have mailed letters and heard nothing. He has had abnormal labs, I was trying to adjust his medications. Maybe set him up for earlier revisit with his caregiver who manages medications.

## 2019-08-18 NOTE — Telephone Encounter (Signed)
I called Samuel Kirk, [phone] Not able to VM (full), call Samuel Kirk, she Samuel Kirk have her call me.  I relayed that we had abnormal labs and want to adjust his medications, can make appt for him if easier.  She Samuel Kirk have her call me back, as pt lives with her.

## 2019-10-26 ENCOUNTER — Ambulatory Visit: Payer: Medicare Other | Admitting: Neurology

## 2019-10-26 NOTE — Progress Notes (Deleted)
PATIENT: Samuel Kirk DOB: [birth year]  REASON FOR VISIT: follow up HISTORY FROM: patient  HISTORY OF PRESENT ILLNESS: Today 10/26/19  Samuel Kirk is an 85 year old male with history of dementia and seizures. When last seen in October, laboratory evaluation showed pancytopenia, WBC 2.3, Hgb 11.5, platelet 121.  At that time, he was taking Depakote and Dilantin.  We started Keppra, plan to discontinue Depakote.  Patient was unable to be reached by telephone or letter. HISTORY 04/27/2019 SS: Samuel Kirk is an 85 year old male with history of dementia and seizures.  He is now residing with his daughter, Burna Mortimer.  She indicates that since he has been taking Dilantin and Depakote he has been doing well.   His daughter handles and administers his medications.  He has not had recurrent seizure. He has remained on Aricept 5 mg at bedtime.  His memory has been stable.  He has good and bad days.  He does have an aide who comes to the home every day from 10-2.  He is able to dress himself, get in the tub.  He requires assistance with bathing.  She indicates he eats 3 meals a day and has a good appetite.  He has not had any recent falls, but he is now using a quad cane.  He is able to go to the bathroom independently.  He does not operate a car.  He is not left alone during the day.  She denies any behavioral problems. He sees Dr. Concepcion Elk for his primary doctor.  He presents today for follow-up accompanied by his daughter, Burna Mortimer.  They deny any new problems or concerns.  REVIEW OF SYSTEMS: Out of a complete 14 system review of symptoms, the patient complains only of the following symptoms, and all other reviewed systems are negative.  ALLERGIES: No Known Allergies  HOME MEDICATIONS: Outpatient Medications Prior to Visit  Medication Sig Dispense Refill  . divalproex (DEPAKOTE) 500 MG DR tablet One tablet in the morning and 2 in the evening 90 tablet 6  . donepezil (ARICEPT) 5 MG tablet Take 5 mg by mouth  every morning.     . levETIRAcetam (KEPPRA) 500 MG tablet Take 1 tablet twice daily x 2 weeks, then take 1.5 tablets twice daily 90 tablet 5  . memantine (NAMENDA) 10 MG tablet Take 1 tablet (10 mg total) by mouth 2 (two) times daily. 60 tablet 5  . phenytoin (DILANTIN) 100 MG ER capsule Take 1 capsule (100 mg total) by mouth 2 (two) times daily. 60 capsule 6  . potassium chloride (K-DUR) 10 MEQ tablet Take 1 tablet (10 mEq total) by mouth daily. 5 tablet 0   No facility-administered medications prior to visit.    PAST MEDICAL HISTORY: Past Medical History:  Diagnosis Date  . Dementia (HCC)   . Gait abnormality 02/25/2019  . Left carotid bruit 02/25/2019  . Seizures (HCC)     PAST SURGICAL HISTORY: Past Surgical History:  Procedure Laterality Date  . FOOT SURGERY      FAMILY HISTORY: Family History  Problem Relation Age of Onset  . Dementia Mother        died at age 53  . Other Father        unsure of history - died at age 12    SOCIAL HISTORY: Social History   Socioeconomic History  . Marital status: Married    Spouse name: Not on file  . Number of children: 2  . Years of education: 7th grade  .  Highest education level: Not on file  Occupational History  . Occupation: Retired  Tobacco Use  . Smoking status: Never Smoker  . Smokeless tobacco: Current User    Types: Chew  Substance and Sexual Activity  . Alcohol use: No  . Drug use: No  . Sexual activity: Not on file  Other Topics Concern  . Not on file  Social History Narrative   Lives at home with his daughter.   Left-handed.   No daily caffeine use.         Social Determinants of Health   Financial Resource Strain:   . Difficulty of Paying Living Expenses:   Food Insecurity:   . Worried About Programme researcher, broadcasting/film/video in the Last Year:   . Barista in the Last Year:   Transportation Needs:   . Freight forwarder (Medical):   Marland Kitchen Lack of Transportation (Non-Medical):   Physical Activity:   .  Days of Exercise per Week:   . Minutes of Exercise per Session:   Stress:   . Feeling of Stress :   Social Connections:   . Frequency of Communication with Friends and Family:   . Frequency of Social Gatherings with Friends and Family:   . Attends Religious Services:   . Active Member of Clubs or Organizations:   . Attends Banker Meetings:   Marland Kitchen Marital Status:   Intimate Partner Violence:   . Fear of Current or Ex-Partner:   . Emotionally Abused:   Marland Kitchen Physically Abused:   . Sexually Abused:       PHYSICAL EXAM  There were no vitals filed for this visit. There is no height or weight on file to calculate BMI.  Generalized: Well developed, in no acute distress   Neurological examination  Mentation: Alert oriented to time, place, history taking. Follows all commands speech and language fluent Cranial nerve II-XII: Pupils were equal round reactive to light. Extraocular movements were full, visual field were full on confrontational test. Facial sensation and strength were normal. Uvula tongue midline. Head turning and shoulder shrug  were normal and symmetric. Motor: The motor testing reveals 5 over 5 strength of all 4 extremities. Good symmetric motor tone is noted throughout.  Sensory: Sensory testing is intact to soft touch on all 4 extremities. No evidence of extinction is noted.  Coordination: Cerebellar testing reveals good finger-nose-finger and heel-to-shin bilaterally.  Gait and station: Gait is normal. Tandem gait is normal. Romberg is negative. No drift is seen.  Reflexes: Deep tendon reflexes are symmetric and normal bilaterally.   DIAGNOSTIC DATA (LABS, IMAGING, TESTING) - I reviewed patient records, labs, notes, testing and imaging myself where available.  Lab Results  Component Value Date   WBC 2.3 (LL) 04/27/2019   HGB 11.5 (L) 04/27/2019   HCT 33.9 (L) 04/27/2019   MCV 98 (H) 04/27/2019   PLT 121 (L) 04/27/2019      Component Value Date/Time    NA 139 04/27/2019 1042   K 3.8 04/27/2019 1042   CL 100 04/27/2019 1042   CO2 28 04/27/2019 1042   GLUCOSE 83 04/27/2019 1042   GLUCOSE 88 02/07/2019 1955   BUN 10 04/27/2019 1042   CREATININE 0.85 04/27/2019 1042   CALCIUM 8.7 04/27/2019 1042   PROT 6.9 04/27/2019 1042   ALBUMIN 3.6 04/27/2019 1042   AST 8 04/27/2019 1042   ALT 3 04/27/2019 1042   ALKPHOS 132 (H) 04/27/2019 1042   BILITOT 0.2 04/27/2019 1042  GFRNONAA 82 04/27/2019 1042   GFRAA 94 04/27/2019 1042   No results found for: CHOL, HDL, LDLCALC, LDLDIRECT, TRIG, CHOLHDL No results found for: HGBA1C Lab Results  Component Value Date   FIEPPIRJ18 841 04/27/2019   Lab Results  Component Value Date   TSH 1.650 04/27/2019      ASSESSMENT AND PLAN 85 y.o. year old male  has a past medical history of Dementia ([place]), Gait abnormality (02/25/2019), Left carotid bruit (02/25/2019), and Seizures ([place]). here with ***   I spent 15 minutes with the patient. 50% of this time was spent   Butler Denmark, [place], [place] 10/26/2019, 5:34 AM [facility] [address] [place], [place] [address] [phone]

## 2019-10-27 ENCOUNTER — Encounter: Payer: Self-pay | Admitting: Neurology

## 2019-11-02 ENCOUNTER — Other Ambulatory Visit: Payer: Self-pay | Admitting: Neurology

## 2019-11-05 ENCOUNTER — Other Ambulatory Visit: Payer: Self-pay | Admitting: Neurology

## 2020-02-08 ENCOUNTER — Ambulatory Visit (INDEPENDENT_AMBULATORY_CARE_PROVIDER_SITE_OTHER): Payer: Medicare Other | Admitting: Podiatrist

## 2020-02-08 ENCOUNTER — Other Ambulatory Visit: Payer: Self-pay

## 2020-02-08 ENCOUNTER — Encounter: Payer: Self-pay | Admitting: Podiatrist

## 2020-02-08 DIAGNOSIS — I739 Peripheral vascular disease, unspecified: Secondary | ICD-10-CM

## 2020-02-08 DIAGNOSIS — M79609 Pain in unspecified limb: Secondary | ICD-10-CM | POA: Diagnosis not present

## 2020-02-08 DIAGNOSIS — B351 Tinea unguium: Secondary | ICD-10-CM | POA: Diagnosis not present

## 2020-02-08 NOTE — Patient Instructions (Addendum)
GENERAL FOOT HEALTH INFORMATION:   Watch your toenails for any signs of infection including drainage, pus redness or swelling along the sides of the toenails.  Soak in epsom salt water and use antibiotic ointment (OTC) if you notice this start to occur.  If the redness does not resolve within 2-3 days, call for an appointment to be seen.  Over the counter antifungal cream may be applied to the toenails-- lamisil, tolnaftate, etc

## 2020-02-08 NOTE — Progress Notes (Signed)
    Chief Complaint  Patient presents with  . Nail Problem    Thick, long, painful toenails. Daughter requests nail trim.     HPI: Patient is 85 y.o. male who presents today for the concerns as listed above.  He presents today with his daughter who is his caregiver.  He relates the nails have grown so long, they are painful to walk.    Active Ambulatory Problems    Diagnosis Date Noted  . Seizures (HCC) 02/25/2019  . Dementia (HCC) 02/25/2019  . Gait abnormality 02/25/2019  . Left carotid bruit 02/25/2019   Resolved Ambulatory Problems    Diagnosis Date Noted  . No Resolved Ambulatory Problems   No Additional Past Medical History     Current Outpatient Medications:  .  divalproex (DEPAKOTE) 500 MG DR tablet, One tablet in the morning and 2 in the evening, Disp: 90 tablet, Rfl: 6 .  donepezil (ARICEPT) 5 MG tablet, Take 5 mg by mouth every morning. , Disp: , Rfl:  .  phenytoin (DILANTIN) 100 MG ER capsule, Take 1 capsule (100 mg total) by mouth 2 (two) times daily., Disp: 60 capsule, Rfl: 6 .  furosemide (LASIX) 20 MG tablet, Take 20 mg by mouth daily as needed., Disp: , Rfl:  .  levETIRAcetam (KEPPRA) 500 MG tablet, Take 1 tablet twice daily x 2 weeks, then take 1.5 tablets twice daily, Disp: 90 tablet, Rfl: 5 .  memantine (NAMENDA) 10 MG tablet, Take 1 tablet (10 mg total) by mouth 2 (two) times daily., Disp: 60 tablet, Rfl: 5 .  potassium chloride (K-DUR) 10 MEQ tablet, Take 1 tablet (10 mEq total) by mouth daily., Disp: 5 tablet, Rfl: 0  No Known Allergies  Review of Systems No fevers, chills, nausea, muscle aches, no difficulty breathing, no calf pain, no chest pain or shortness of breath.   Physical Exam  GENERAL APPEARANCE: Alert, conversant. Appropriately groomed. No acute distress.   VASCULAR: Pedal pulses nonpalpable DP and PT bilateral.  Capillary refill time is immediate to all digits,  Proximal to distal cooling it warm to warm.  Digital hair growth absent.   Generalized swelling to b/l legs and feet noted.    NEUROLOGIC: sensation is intact epicritically and protectively to 5.07 monofilament at 5/5 sites bilateral.  Light touch is intact bilateral, vibratory sensation intact bilateral, achilles tendon reflex is intact bilateral.   MUSCULOSKELETAL: acceptable muscle strength, tone and stability bilateral. Surgical scars on the right foot from a previous bunion and hammertoe repair 2-5.  Left foot has a notable bunion and overlapping 2nd toe and contracture or lesser digits 2-5.  DERMATOLOGIC: skin is warm, supple, and dry.  No open lesions noted.  No rash, no pre ulcerative lesions. Digital nails are significantly elongated, thick, discolored, dystrophic, brittle with subungual debris present and clinically mycotic x 10.     Assessment     ICD-10-CM   1. Pain due to onychomycosis of nail  B35.1    M79.609   2. Peripheral angiopathies (HCC)  I73.9      Plan  Debridement of toenails was recommended.  Onychoreduction of symptomatic toenails was performed via nail nipper and power burr without iatrogenic incident.  Patient was instructed on signs and symptoms of infection and was told to call immediately should any of these arise.

## 2020-02-09 IMAGING — CT CT CERVICAL SPINE WITHOUT CONTRAST
4 of 8 series · 14 of 33 positions shown, 15 images · non-contrast
Comparison: February 27, 2013

CLINICAL DATA: Seizure today.  Fall.  Pain.

EXAM:
CT HEAD WITHOUT CONTRAST
CT CERVICAL SPINE WITHOUT CONTRAST
TECHNIQUE: Multidetector CT imaging of the head and cervical spine was
performed following the standard protocol without intravenous
contrast. Multiplanar CT image reconstructions of the cervical spine
were also generated.

[Series 9: c spine soft · axial · 0.41mm/px · z∈[-247,-189]mm · 2 of 89 slices shown]
[im 30/89  soft-tissue]
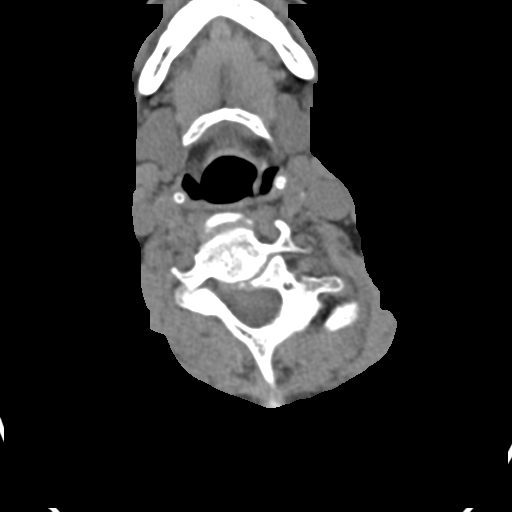
[im 59/89  soft-tissue]
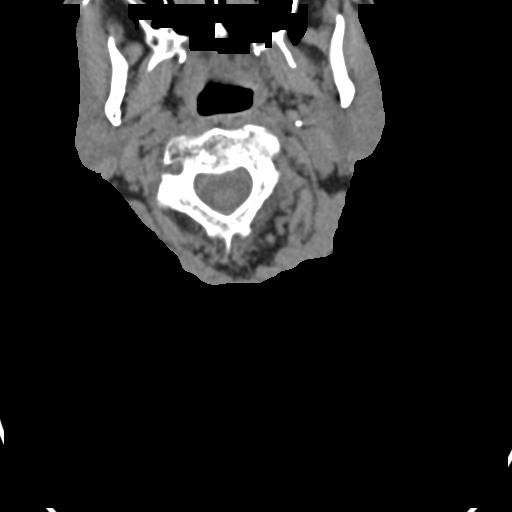

[Series 11: orthogonal bone · axial · 0.26mm/px · z∈[-292,-179]mm · 4 of 113 slices shown, 5 images]
[im 23/113  soft-tissue]
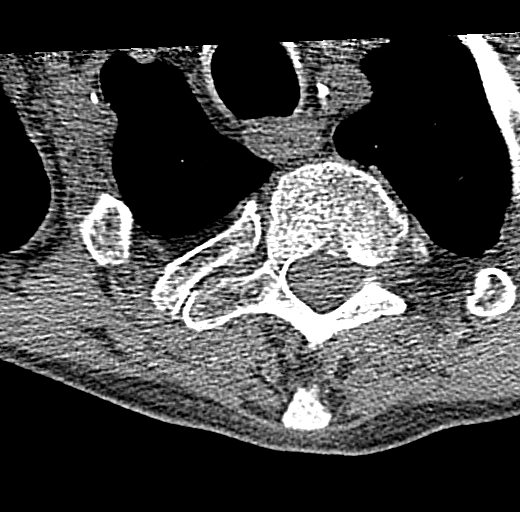
[im 23/113  bone]
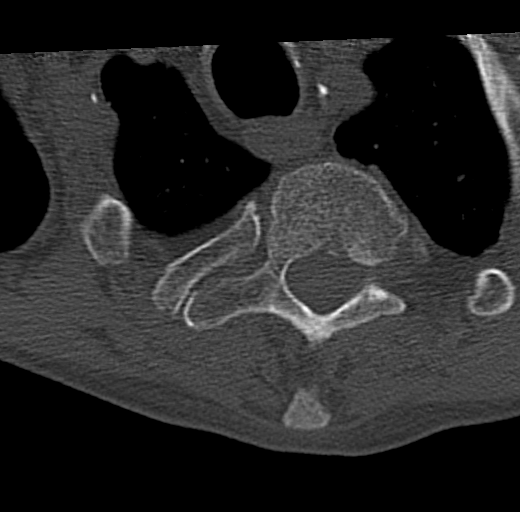
[im 45/113  bone]
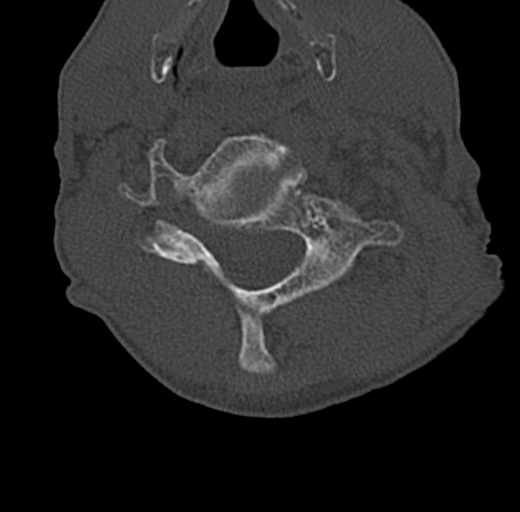
[im 68/113  bone]
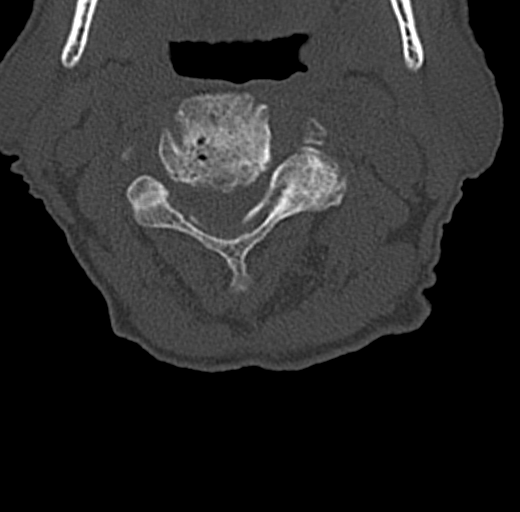
[im 90/113  bone]
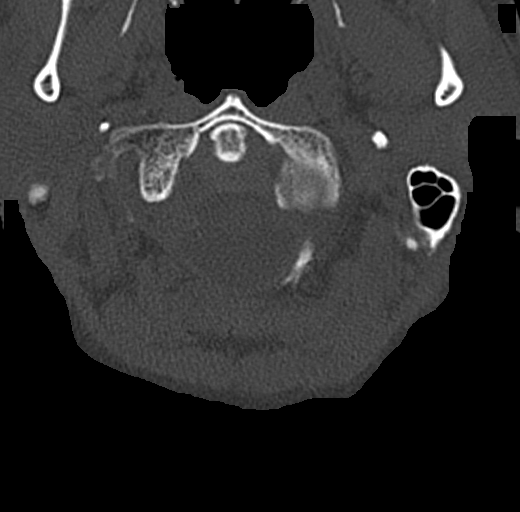

[Series 12: coronal bone · coronal · 0.28mm/px · 3 of 73 slices shown]
[im 43/73  bone]
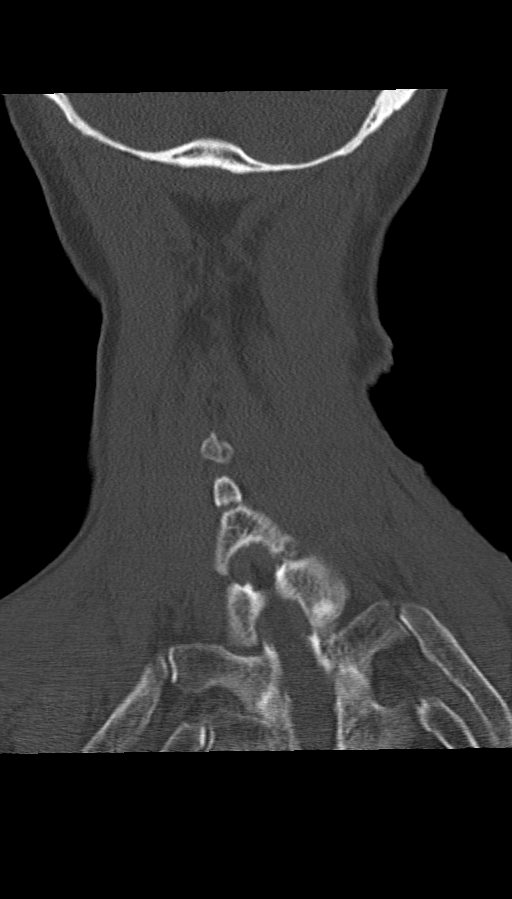
[im 50/73  bone]
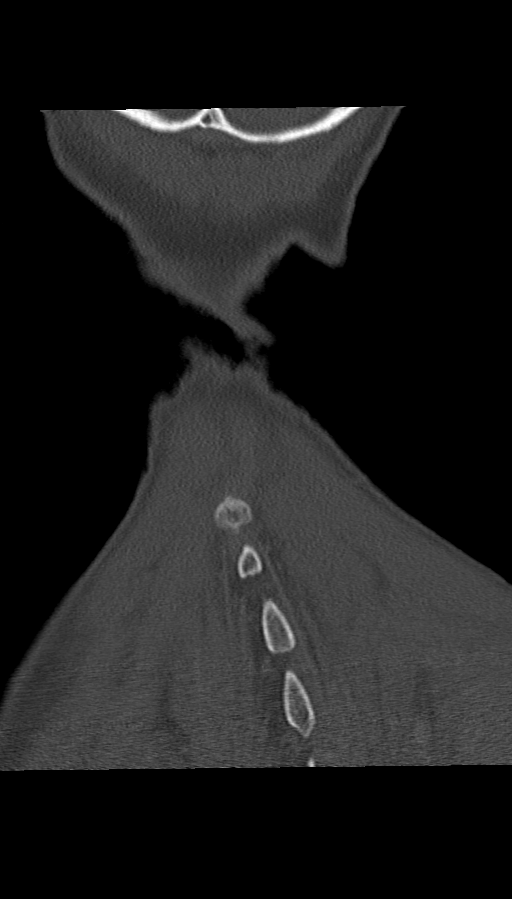
[im 58/73  bone]
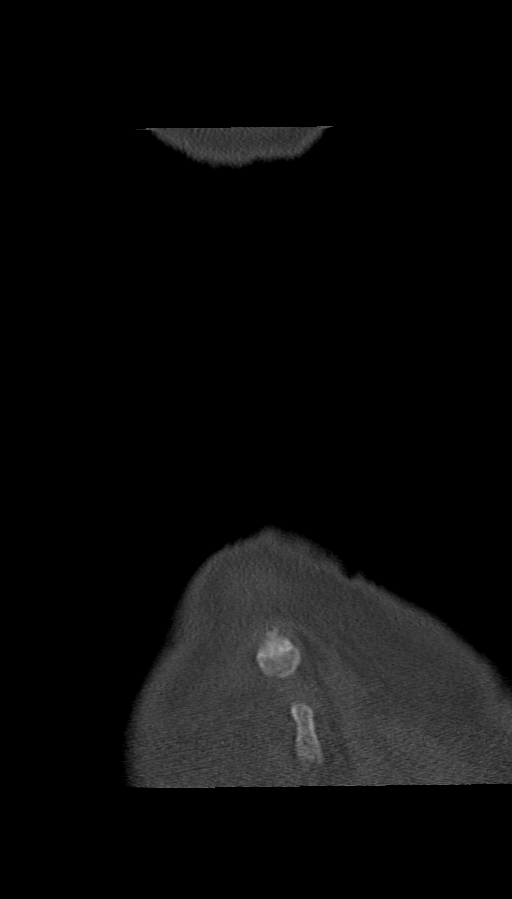

[Series 13: sagittal bone · sagittal · 0.32mm/px · 5 of 70 slices shown]
[im 12/70  bone]
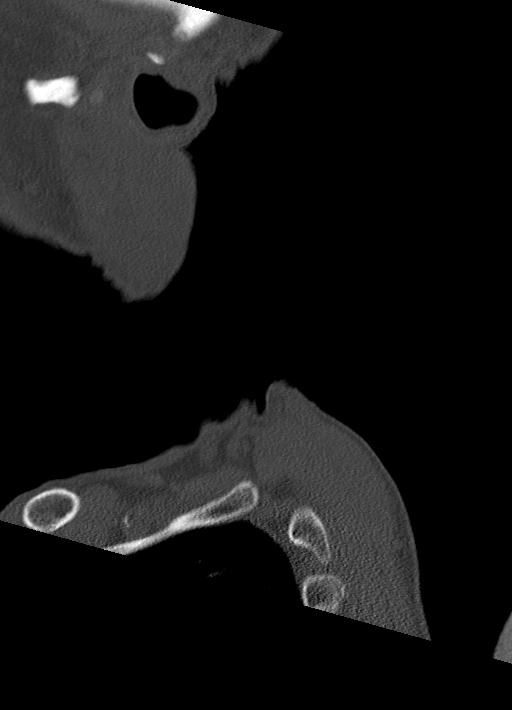
[im 24/70  bone]
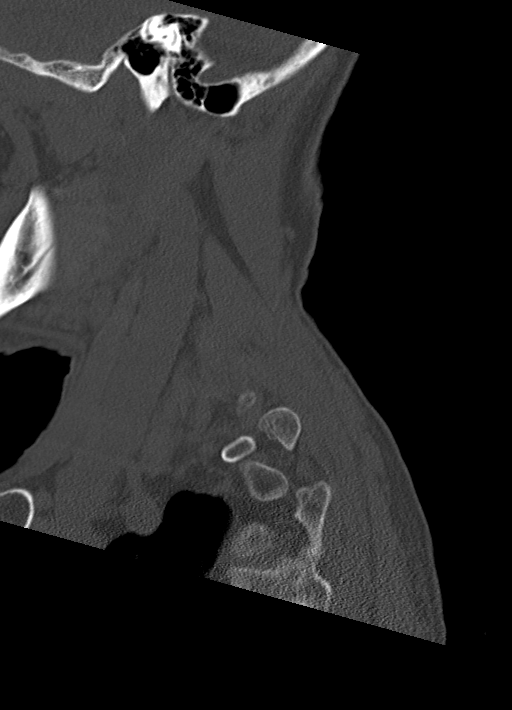
[im 35/70  bone]
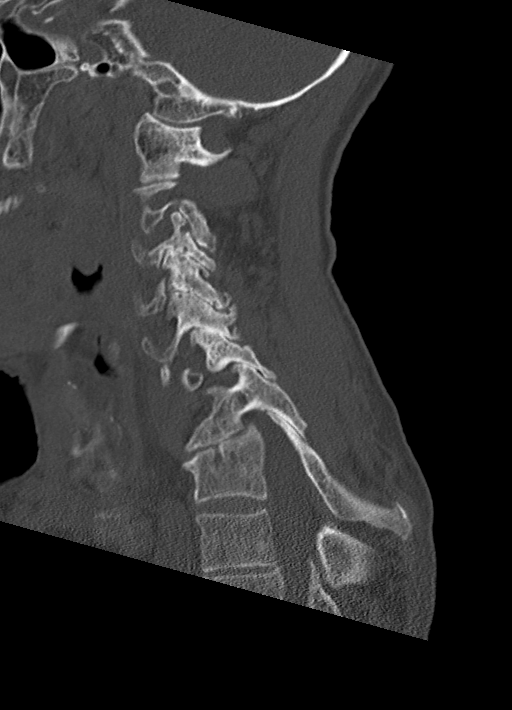
[im 47/70  bone]
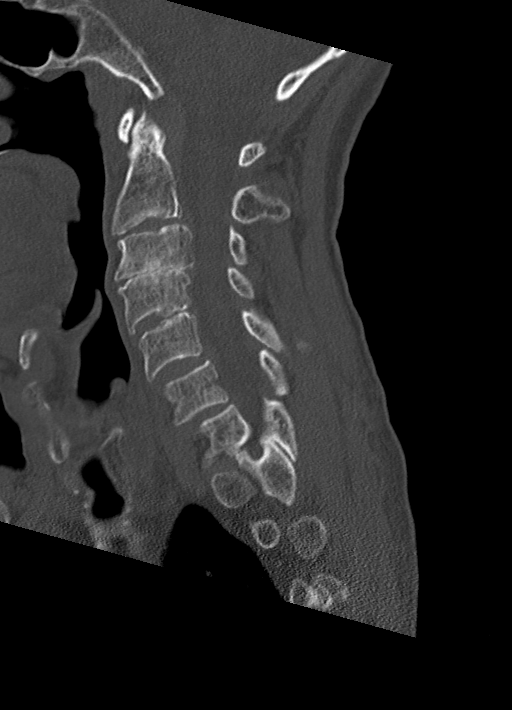
[im 58/70  bone]
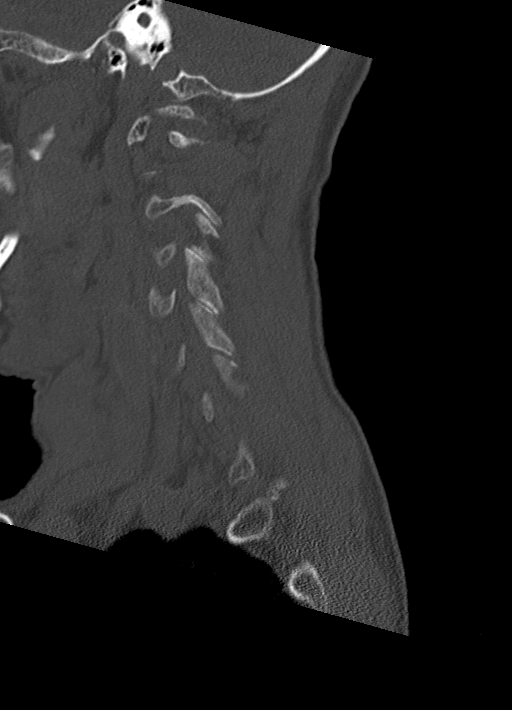

[14 of 33 positions shown; findings below may reference images not displayed]

FINDINGS: CT HEAD FINDINGS

Brain: No subdural, epidural, or subarachnoid hemorrhage. Ventricles
and sulci are prominent but stable. Cerebellum, brainstem, and basal
cisterns are normal. No mass effect or midline shift. No evidence of
acute cortical ischemia or infarct.

Vascular: Calcified atherosclerosis is seen in the intracranial
carotids.

Skull: Normal. Negative for fracture or focal lesion.

Sinuses/Orbits: No acute finding.

Other: There is soft tissue swelling over the right forehead region.
Extracranial soft tissues are otherwise normal.

CT CERVICAL SPINE FINDINGS

Alignment: There is scoliotic curvature in the lower cervical spine,
apex to the left, unchanged. Mild retrolisthesis of C3 versus C4 is
stable. No other malalignment.

Skull base and vertebrae: No fracture or dislocation.

Soft tissues and spinal canal: No prevertebral fluid or swelling. No
visible canal hematoma.

Disc levels:  Multilevel degenerative changes.

Upper chest: Negative.

Other: No other abnormalities.
IMPRESSION: 1. Soft tissue swelling over the right forehead.
2. No acute intracranial abnormalities.
3. No fracture or traumatic malalignment in the cervical spine.
Severe degenerative changes.

## 2020-03-12 ENCOUNTER — Other Ambulatory Visit: Payer: Self-pay | Admitting: Neurology

## 2020-04-26 ENCOUNTER — Other Ambulatory Visit: Payer: Self-pay | Admitting: Neurology

## 2020-04-30 ENCOUNTER — Other Ambulatory Visit: Payer: Self-pay | Admitting: Neurology

## 2020-05-09 ENCOUNTER — Ambulatory Visit: Payer: Medicare Other | Admitting: Podiatry

## 2020-05-16 ENCOUNTER — Other Ambulatory Visit: Payer: Self-pay | Admitting: Neurology

## 2020-05-27 ENCOUNTER — Other Ambulatory Visit: Payer: Self-pay | Admitting: Neurology

## 2020-09-05 ENCOUNTER — Other Ambulatory Visit: Payer: Self-pay | Admitting: Neurology

## 2020-09-08 ENCOUNTER — Other Ambulatory Visit: Payer: Self-pay | Admitting: Internal Medicine

## 2020-09-09 LAB — COMPLETE METABOLIC PANEL WITH GFR
AG Ratio: 1.3 (calc) (ref 1.0–2.5)
ALT: 4 U/L — ABNORMAL LOW (ref 9–46)
AST: 10 U/L (ref 10–35)
Albumin: 3.6 g/dL (ref 3.6–5.1)
Alkaline phosphatase (APISO): 44 U/L (ref 35–144)
BUN: 14 mg/dL (ref 7–25)
CO2: 32 mmol/L (ref 20–32)
Calcium: 9.1 mg/dL (ref 8.6–10.3)
Chloride: 105 mmol/L (ref 98–110)
Creat: 0.86 mg/dL (ref 0.70–1.11)
GFR, Est African American: 93 mL/min/{1.73_m2} (ref >=60)
GFR, Est Non African American: 80 mL/min/{1.73_m2} (ref >=60)
Globulin: 2.7 g/dL (calc) (ref 1.9–3.7)
Glucose, Bld: 87 mg/dL (ref 65–99)
Potassium: 3.3 mmol/L — ABNORMAL LOW (ref 3.5–5.3)
Sodium: 146 mmol/L (ref 135–146)
Total Bilirubin: 0.5 mg/dL (ref 0.2–1.2)
Total Protein: 6.3 g/dL (ref 6.1–8.1)

## 2020-09-09 LAB — CBC
HCT: 30.6 % — ABNORMAL LOW (ref 38.5–50.0)
Hemoglobin: 10.1 g/dL — ABNORMAL LOW (ref 13.2–17.1)
MCH: 32.6 pg (ref 27.0–33.0)
MCHC: 33 g/dL (ref 32.0–36.0)
MCV: 98.7 fL (ref 80.0–100.0)
MPV: 11.9 fL (ref 7.5–12.5)
Platelets: 300 10*3/uL (ref 140–400)
RBC: 3.1 10*6/uL — ABNORMAL LOW (ref 4.20–5.80)
RDW: 14.9 % (ref 11.0–15.0)
WBC: 4.1 10*3/uL (ref 3.8–10.8)

## 2020-09-09 LAB — LIPID PANEL
Cholesterol: 125 mg/dL (ref <200)
HDL: 53 mg/dL (ref >=40)
LDL Cholesterol (Calc): 59 mg/dL (calc)
Non-HDL Cholesterol (Calc): 72 mg/dL (calc) (ref <130)
Total CHOL/HDL Ratio: 2.4 (calc) (ref <5.0)
Triglycerides: 52 mg/dL (ref <150)

## 2020-09-09 LAB — TSH: TSH: 1.19 mIU/L (ref 0.40–4.50)

## 2020-09-29 ENCOUNTER — Ambulatory Visit: Payer: Medicare Other | Admitting: Podiatrist

## 2020-10-18 ENCOUNTER — Ambulatory Visit: Payer: Medicare Other | Admitting: Podiatrist

## 2021-01-30 ENCOUNTER — Encounter: Payer: Self-pay | Admitting: Podiatry

## 2021-01-30 ENCOUNTER — Other Ambulatory Visit: Payer: Self-pay

## 2021-01-30 ENCOUNTER — Ambulatory Visit (INDEPENDENT_AMBULATORY_CARE_PROVIDER_SITE_OTHER): Payer: Medicare Other | Admitting: Podiatry

## 2021-01-30 DIAGNOSIS — M79674 Pain in right toe(s): Secondary | ICD-10-CM | POA: Diagnosis not present

## 2021-01-30 DIAGNOSIS — B351 Tinea unguium: Secondary | ICD-10-CM

## 2021-01-30 DIAGNOSIS — M79675 Pain in left toe(s): Secondary | ICD-10-CM

## 2021-01-30 NOTE — Progress Notes (Signed)
  Subjective:  Patient ID: Samuel Kirk, male    DOB: 1935/01/26,  MRN: [id number]  Chief Complaint  Patient presents with   Nail Problem    nail deformity w/ hallux valgus;    86 y.o. male presents with the above complaint. History confirmed with patient and his daughter.  Objective:  Physical Exam: warm, good capillary refill, no trophic changes or ulcerative lesions, and normal sensory exam. Left Foot: dystrophic yellowed discolored nail plates with subungual debris Right Foot: dystrophic yellowed discolored nail plates with subungual debris  Assessment:   1. Pain due to onychomycosis of toenails of both feet      Plan:  Patient was evaluated and treated and all questions answered.  Discussed the etiology and treatment options for the condition in detail with the patient. Educated patient on the topical and oral treatment options for mycotic nails. Recommended debridement of the nails today. Sharp and mechanical debridement performed of all painful and mycotic nails today. Nails debrided in length and thickness using a nail nipper to level of comfort. Discussed treatment options including appropriate shoe gear. Follow up as needed for painful nails.    Return in about 3 months (around 05/02/2021) for painful nails.

## 2021-05-08 ENCOUNTER — Ambulatory Visit: Payer: Medicare Other | Admitting: Podiatry

## 2021-05-16 ENCOUNTER — Ambulatory Visit (INDEPENDENT_AMBULATORY_CARE_PROVIDER_SITE_OTHER): Payer: Self-pay | Admitting: Podiatry

## 2021-05-16 DIAGNOSIS — Z91199 Patient's noncompliance with other medical treatment and regimen due to unspecified reason: Secondary | ICD-10-CM

## 2021-05-21 NOTE — Progress Notes (Signed)
   Complete physical exam  Patient: Samuel Kirk   DOB: 04/21/1997   86 y.o. Male  MRN: [id number]  Subjective:    No chief complaint on file.   Samuel Kirk is a 86 y.o. male who presents today for a complete physical exam. She reports consuming a {diet types:17450} diet. {types:19826} She generally feels {DESC; WELL/FAIRLY WELL/POORLY:18703}. She reports sleeping {DESC; WELL/FAIRLY WELL/POORLY:18703}. She {does/does not:200015} have additional problems to discuss today.    Most recent fall risk assessment:    12/27/2021   10:42 AM  Fall Risk   Falls in the past year? 0  Number falls in past yr: 0  Injury with Fall? 0  Risk for fall due to : No Fall Risks  Follow up Falls evaluation completed     Most recent depression screenings:    12/27/2021   10:42 AM 11/17/2020   10:46 AM  PHQ 2/9 Scores  PHQ - 2 Score 0 0  PHQ- 9 Score 5     {VISON DENTAL STD PSA (Optional):27386}  {History (Optional):23778}  Patient Care Team: Jessup, Joy, NP as PCP - General (Nurse Practitioner)   Outpatient Medications Prior to Visit  Medication Sig   fluticasone (FLONASE) 50 MCG/ACT nasal spray Place 2 sprays into both nostrils in the morning and at bedtime. After 7 days, reduce to once daily.   norgestimate-ethinyl estradiol (SPRINTEC 28) 0.25-35 MG-MCG tablet Take 1 tablet by mouth daily.   Nystatin POWD Apply liberally to affected area 2 times per day   spironolactone (ALDACTONE) 100 MG tablet Take 1 tablet (100 mg total) by mouth daily.   No facility-administered medications prior to visit.    ROS        Objective:     There were no vitals taken for this visit. {Vitals History (Optional):23777}  Physical Exam   No results found for any visits on 02/01/22. {Show previous labs (optional):23779}    Assessment & Plan:    Routine Health Maintenance and Physical Exam  Immunization History  Administered Date(s) Administered   DTaP 07/05/1999, 08/31/1999,  11/09/1999, 07/25/2000, 02/08/2004   Hepatitis A 12/05/2007, 12/10/2008   Hepatitis B 04/22/1999, 05/30/1999, 11/09/1999   HiB (PRP-OMP) 07/05/1999, 08/31/1999, 11/09/1999, 07/25/2000   IPV 07/05/1999, 08/31/1999, 04/29/2000, 02/08/2004   Influenza,inj,Quad PF,6+ Mos 03/12/2014   Influenza-Unspecified 06/11/2012   MMR 04/29/2001, 02/08/2004   Meningococcal Polysaccharide 12/10/2011   Pneumococcal Conjugate-13 07/25/2000   Pneumococcal-Unspecified 11/09/1999, 01/23/2000   Tdap 12/10/2011   Varicella 04/29/2000, 12/05/2007    Health Maintenance  Topic Date Due   HIV Screening  Never done   Hepatitis C Screening  Never done   INFLUENZA VACCINE  01/30/2022   PAP-Cervical Cytology Screening  02/01/2022 (Originally 04/20/2020)   PAP SMEAR-Modifier  02/01/2022 (Originally 04/20/2020)   TETANUS/TDAP  02/01/2022 (Originally 12/09/2021)   HPV VACCINES  Discontinued   COVID-19 Vaccine  Discontinued    Discussed health benefits of physical activity, and encouraged her to engage in regular exercise appropriate for her age and condition.  Problem List Items Addressed This Visit   None Visit Diagnoses     Annual physical exam    -  Primary   Cervical cancer screening       Need for Tdap vaccination          No follow-ups on file.     Joy Jessup, NP   

## 2022-01-31 ENCOUNTER — Emergency Department (HOSPITAL_COMMUNITY): Payer: Medicare Other

## 2022-01-31 ENCOUNTER — Observation Stay (HOSPITAL_COMMUNITY)
Admission: EM | Admit: 2022-01-31 | Discharge: 2022-02-02 | Disposition: A | Discharge status: Home Health | Payer: Medicare Other | Attending: Family Medicine | Admitting: Family Medicine

## 2022-01-31 ENCOUNTER — Other Ambulatory Visit: Payer: Self-pay

## 2022-01-31 ENCOUNTER — Encounter (HOSPITAL_COMMUNITY): Payer: Self-pay

## 2022-01-31 DIAGNOSIS — R569 Unspecified convulsions: Secondary | ICD-10-CM

## 2022-01-31 DIAGNOSIS — Z79899 Other long term (current) drug therapy: Secondary | ICD-10-CM | POA: Diagnosis not present

## 2022-01-31 DIAGNOSIS — J690 Pneumonitis due to inhalation of food and vomit: Secondary | ICD-10-CM | POA: Diagnosis not present

## 2022-01-31 DIAGNOSIS — Z20822 Contact with and (suspected) exposure to covid-19: Secondary | ICD-10-CM | POA: Diagnosis not present

## 2022-01-31 DIAGNOSIS — G40909 Epilepsy, unspecified, not intractable, without status epilepticus: Secondary | ICD-10-CM | POA: Diagnosis not present

## 2022-01-31 DIAGNOSIS — F0390 Unspecified dementia without behavioral disturbance: Secondary | ICD-10-CM | POA: Diagnosis not present

## 2022-01-31 DIAGNOSIS — E43 Unspecified severe protein-calorie malnutrition: Secondary | ICD-10-CM | POA: Insufficient documentation

## 2022-01-31 DIAGNOSIS — J449 Chronic obstructive pulmonary disease, unspecified: Secondary | ICD-10-CM | POA: Diagnosis not present

## 2022-01-31 DIAGNOSIS — G301 Alzheimer's disease with late onset: Secondary | ICD-10-CM | POA: Diagnosis not present

## 2022-01-31 DIAGNOSIS — I10 Essential (primary) hypertension: Secondary | ICD-10-CM | POA: Diagnosis not present

## 2022-01-31 DIAGNOSIS — R7989 Other specified abnormal findings of blood chemistry: Secondary | ICD-10-CM | POA: Diagnosis present

## 2022-01-31 DIAGNOSIS — R778 Other specified abnormalities of plasma proteins: Secondary | ICD-10-CM | POA: Diagnosis not present

## 2022-01-31 DIAGNOSIS — R0602 Shortness of breath: Secondary | ICD-10-CM | POA: Diagnosis present

## 2022-01-31 DIAGNOSIS — F0280 Dementia in other diseases classified elsewhere without behavioral disturbance: Secondary | ICD-10-CM

## 2022-01-31 LAB — LIPID PANEL
Cholesterol: 112 mg/dL (ref 0–200)
HDL: 47 mg/dL (ref >40)
LDL Cholesterol: 56 mg/dL (ref 0–99)
Total CHOL/HDL Ratio: 2.4 RATIO
Triglycerides: 43 mg/dL (ref <150)
VLDL: 9 mg/dL (ref 0–40)

## 2022-01-31 LAB — CBC WITH DIFFERENTIAL/PLATELET
Abs Immature Granulocytes: 0.04 10*3/uL (ref 0.00–0.07)
Basophils Absolute: 0.1 10*3/uL (ref 0.0–0.1)
Basophils Relative: 1 %
Eosinophils Absolute: 0.1 10*3/uL (ref 0.0–0.5)
Eosinophils Relative: 1 %
HCT: 36.5 % — ABNORMAL LOW (ref 39.0–52.0)
Hemoglobin: 12 g/dL — ABNORMAL LOW (ref 13.0–17.0)
Immature Granulocytes: 0 %
Lymphocytes Relative: 18 %
Lymphs Abs: 1.6 10*3/uL (ref 0.7–4.0)
MCH: 32 pg (ref 26.0–34.0)
MCHC: 32.9 g/dL (ref 30.0–36.0)
MCV: 97.3 fL (ref 80.0–100.0)
Monocytes Absolute: 0.7 10*3/uL (ref 0.1–1.0)
Monocytes Relative: 7 %
Neutro Abs: 6.4 10*3/uL (ref 1.7–7.7)
Neutrophils Relative %: 73 %
Platelets: 809 10*3/uL — ABNORMAL HIGH (ref 150–400)
RBC: 3.75 MIL/uL — ABNORMAL LOW (ref 4.22–5.81)
RDW: 25.3 % — ABNORMAL HIGH (ref 11.5–15.5)
WBC: 8.9 10*3/uL (ref 4.0–10.5)
nRBC: 1.2 % — ABNORMAL HIGH (ref 0.0–0.2)

## 2022-01-31 LAB — COMPREHENSIVE METABOLIC PANEL
ALT: 16 U/L (ref 0–44)
AST: 42 U/L — ABNORMAL HIGH (ref 15–41)
Albumin: 3 g/dL — ABNORMAL LOW (ref 3.5–5.0)
Alkaline Phosphatase: 68 U/L (ref 38–126)
Anion gap: 4 — ABNORMAL LOW (ref 5–15)
BUN: 17 mg/dL (ref 8–23)
CO2: 24 mmol/L (ref 22–32)
Calcium: 9 mg/dL (ref 8.9–10.3)
Chloride: 109 mmol/L (ref 98–111)
Creatinine, Ser: 0.98 mg/dL (ref 0.61–1.24)
GFR, Estimated: >60 mL/min (ref >60)
Glucose, Bld: 103 mg/dL — ABNORMAL HIGH (ref 70–99)
Potassium: 4.7 mmol/L (ref 3.5–5.1)
Sodium: 137 mmol/L (ref 135–145)
Total Bilirubin: 0.5 mg/dL (ref 0.3–1.2)
Total Protein: 7.5 g/dL (ref 6.5–8.1)

## 2022-01-31 LAB — SARS CORONAVIRUS 2 BY RT PCR: SARS Coronavirus 2 by RT PCR: NEGATIVE

## 2022-01-31 LAB — BRAIN NATRIURETIC PEPTIDE: B Natriuretic Peptide: 189.3 pg/mL — ABNORMAL HIGH (ref 0.0–100.0)

## 2022-01-31 LAB — TROPONIN I (HIGH SENSITIVITY)
Troponin I (High Sensitivity): 357 ng/L (ref <18)
Troponin I (High Sensitivity): 427 ng/L (ref <18)

## 2022-01-31 MED ORDER — ASPIRIN 325 MG PO TABS
325.0000 mg | ORAL_TABLET | Freq: Once | ORAL | Status: AC
  Administered 2022-01-31: 325 mg via ORAL
  Filled 2022-01-31: qty 1

## 2022-01-31 MED ORDER — IOHEXOL 350 MG/ML SOLN
75.0000 mL | Freq: Once | INTRAVENOUS | Status: AC | PRN
  Administered 2022-01-31: 75 mL via INTRAVENOUS

## 2022-01-31 MED ORDER — ALBUTEROL SULFATE (2.5 MG/3ML) 0.083% IN NEBU: 2.5000 mg | INHALATION_SOLUTION | RESPIRATORY_TRACT | Status: DC | PRN

## 2022-01-31 MED ORDER — ONDANSETRON HCL 4 MG PO TABS: 4.0000 mg | ORAL_TABLET | Freq: Four times a day (QID) | ORAL | Status: DC | PRN

## 2022-01-31 MED ORDER — HEPARIN SODIUM (PORCINE) 5000 UNIT/ML IJ SOLN
5000.0000 [IU] | Freq: Three times a day (TID) | INTRAMUSCULAR | Status: DC
  Administered 2022-02-01 – 2022-02-02 (×5): 5000 [IU] via SUBCUTANEOUS
  Filled 2022-01-31 (×5): qty 1

## 2022-01-31 MED ORDER — ALBUTEROL SULFATE HFA 108 (90 BASE) MCG/ACT IN AERS: 2.0000 | INHALATION_SPRAY | RESPIRATORY_TRACT | Status: DC | PRN

## 2022-01-31 MED ORDER — ASPIRIN 81 MG PO TBEC
81.0000 mg | DELAYED_RELEASE_TABLET | Freq: Every day | ORAL | Status: DC
Start: 2022-02-01 — End: 2022-02-03
  Administered 2022-02-01 – 2022-02-02 (×2): 81 mg via ORAL
  Filled 2022-01-31 (×2): qty 1

## 2022-01-31 MED ORDER — MEMANTINE HCL 10 MG PO TABS: 10.0000 mg | ORAL_TABLET | Freq: Two times a day (BID) | ORAL | Status: DC

## 2022-01-31 MED ORDER — LEVETIRACETAM 500 MG PO TABS: 750.0000 mg | ORAL_TABLET | Freq: Two times a day (BID) | ORAL | Status: DC

## 2022-01-31 MED ORDER — ENSURE ENLIVE PO LIQD
237.0000 mL | Freq: Two times a day (BID) | ORAL | Status: DC
  Administered 2022-02-01 (×2): 237 mL via ORAL

## 2022-01-31 MED ORDER — DIVALPROEX SODIUM ER 500 MG PO TB24
1000.0000 mg | ORAL_TABLET | Freq: Every day | ORAL | Status: DC
  Administered 2022-01-31 – 2022-02-01 (×2): 1000 mg via ORAL
  Filled 2022-01-31 (×2): qty 2

## 2022-01-31 MED ORDER — ACETAMINOPHEN 650 MG RE SUPP: 650.0000 mg | Freq: Four times a day (QID) | RECTAL | Status: DC | PRN

## 2022-01-31 MED ORDER — SODIUM CHLORIDE 0.9 % IV SOLN
1.5000 g | Freq: Three times a day (TID) | INTRAVENOUS | Status: DC
  Administered 2022-02-01 – 2022-02-02 (×5): 1.5 g via INTRAVENOUS
  Filled 2022-01-31 (×8): qty 4

## 2022-01-31 MED ORDER — PHENYTOIN SODIUM EXTENDED 100 MG PO CAPS
100.0000 mg | ORAL_CAPSULE | Freq: Two times a day (BID) | ORAL | Status: DC
  Administered 2022-01-31: 100 mg via ORAL
  Filled 2022-01-31: qty 1

## 2022-01-31 MED ORDER — ONDANSETRON HCL 4 MG/2ML IJ SOLN: 4.0000 mg | Freq: Four times a day (QID) | INTRAMUSCULAR | Status: DC | PRN

## 2022-01-31 MED ORDER — DONEPEZIL HCL 5 MG PO TABS
5.0000 mg | ORAL_TABLET | Freq: Every morning | ORAL | Status: DC
  Administered 2022-02-01 – 2022-02-02 (×2): 5 mg via ORAL
  Filled 2022-01-31 (×2): qty 1

## 2022-01-31 MED ORDER — ACETAMINOPHEN 325 MG PO TABS: 650.0000 mg | ORAL_TABLET | Freq: Four times a day (QID) | ORAL | Status: DC | PRN

## 2022-01-31 MED ORDER — SODIUM CHLORIDE 0.9 % IV SOLN
3.0000 g | Freq: Once | INTRAVENOUS | Status: AC
  Administered 2022-01-31: 3 g via INTRAVENOUS
  Filled 2022-01-31: qty 8

## 2022-01-31 MED ORDER — DIVALPROEX SODIUM 250 MG PO DR TAB
500.0000 mg | DELAYED_RELEASE_TABLET | Freq: Every day | ORAL | Status: DC
  Administered 2022-02-01 – 2022-02-02 (×2): 500 mg via ORAL
  Filled 2022-01-31 (×2): qty 2

## 2022-01-31 NOTE — H&P (Signed)
History and Physical    Samuel Kirk ERD:[id number] DOB: 08-04-1934 DOA: 01/31/2022  DOS: the patient was seen and examined on 01/31/2022  PCP: Fleet Contras, MD   Patient coming from: Home  I have personally briefly reviewed patient's old medical records in [place] [facility]  CC: SOB HPI: 87 year old African-American male history of dementia, reported seizures, reports to the ER today with complaints of shortness of breath.  Patient is demented and cannot give any history.  He is here with his daughters Junious Dresser and Burna Mortimer.  Burna Mortimer gives the majority of the history.  3 weeks ago the patient went to go visit his daughter.  His daughter lives in a third floor apartment.  He walked up to her apartment and became short of breath.  His daughter Burna Mortimer took him to the primary care doctor's office due to the patient's complaints of shortness of breath.  Reportedly he was supposed to have a chest x-ray done 3 weeks ago but she never got this done.  Patient's been having increasing shortness of breath.  The daughter Burna Mortimer states the patient has been eating salt and sugar out of the cabinet of her house.  He has dementia.  He opens cabinets and wanders around the house and outside.  He is supposed to have 24-hour supervision at home either by her or her husband but the patient does wander around at night.  Work-up here in the ER shows an elevated troponin I of 357 and then 427.  EKG shows no acute ST changes.  CTPA was negative for PE but did have some bilateral lower lobe airspace opacities.  EDP is discussed the case with cardiology who wanted the patient admitted so they could consult on him.  Patient is on room air with sats of 98 to 100%.  He is afebrile with a temp of 97.2.  [facility] hospitalist contacted for admission.   ED Course: CTPA negative for PE but showed bilateral lower lobe opacities.  Review of Systems:  Review of Systems  Unable to perform ROS: Dementia    Past Medical History:   Diagnosis Date   Dementia (HCC)    Gait abnormality 02/25/2019   Left carotid bruit 02/25/2019   Seizures (HCC)     Past Surgical History:  Procedure Laterality Date   FOOT SURGERY       reports that he has never smoked. His smokeless tobacco use includes chew. He reports that he does not drink alcohol and does not use drugs.  No Known Allergies  Family History  Problem Relation Age of Onset   Dementia Mother        died at age 43   Other Father        unsure of history - died at age 32    Prior to Admission medications   Medication Sig Start Date End Date Taking? Authorizing Provider  divalproex (DEPAKOTE) 500 MG DR tablet One tablet in the morning and 2 in the evening 04/27/19   Glean Salvo, NP  donepezil (ARICEPT) 5 MG tablet Take 5 mg by mouth every morning.     [provider]  furosemide (LASIX) 20 MG tablet Take 20 mg by mouth daily as needed. 01/10/20   [provider]  levETIRAcetam (KEPPRA) 500 MG tablet Take 1 tablet twice daily x 2 weeks, then take 1.5 tablets twice daily 04/29/19   Glean Salvo, NP  memantine (NAMENDA) 10 MG tablet TAKE 1 TABLET BY MOUTH TWICE A DAY 03/14/20  Glean Salvo, NP  phenytoin (DILANTIN) 100 MG ER capsule TAKE 1 CAPSULE BY MOUTH TWICE A DAY 05/02/20   Glean Salvo, NP  potassium chloride (K-DUR) 10 MEQ tablet Take 1 tablet (10 mEq total) by mouth daily. 02/07/19   Tilden Fossa, MD    Physical Exam: Vitals:   01/31/22 1850 01/31/22 1915 01/31/22 1930 01/31/22 2000  BP:  122/76 125/89 121/70  Pulse:  67 65 62  Resp:  (!) 22 20 (!) 26  Temp: (!) 97.2 F (36.2 C)     TempSrc: Oral     SpO2:  100% 98% 98%  Weight:      Height:        Physical Exam Vitals and nursing note reviewed.  Constitutional:      Comments: Demented, thin African-American male.  No acute distress.  HENT:     Head: Normocephalic and atraumatic.     Nose: Nose normal.  Eyes:     General: No scleral icterus. Cardiovascular:      Rate and Rhythm: Normal rate and regular rhythm.     Pulses: Normal pulses.  Pulmonary:     Effort: Pulmonary effort is normal.     Breath sounds: Normal breath sounds.  Abdominal:     General: Bowel sounds are normal. There is no distension.     Palpations: Abdomen is soft.     Tenderness: There is no abdominal tenderness.  Musculoskeletal:     Comments: Trace left pedal and ankle edema.  Skin:    General: Skin is warm and dry.     Capillary Refill: Capillary refill takes less than 2 seconds.  Neurological:     Mental Status: He is disoriented.      Labs on Admission: I have personally reviewed following labs and imaging studies  CBC: Recent Labs  Lab 01/31/22 1521  WBC 8.9  NEUTROABS PENDING  HGB 12.0*  HCT 36.5*  MCV 97.3  PLT 809*   Basic Metabolic Panel: Recent Labs  Lab 01/31/22 1521  NA 137  K 4.7  CL 109  CO2 24  GLUCOSE 103*  BUN 17  CREATININE 0.98  CALCIUM 9.0   GFR: Estimated Creatinine Clearance: 36 mL/min (by C-G formula based on SCr of 0.98 mg/dL). Liver Function Tests: Recent Labs  Lab 01/31/22 1521  AST 42*  ALT 16  ALKPHOS 68  BILITOT 0.5  PROT 7.5  ALBUMIN 3.0*   No results for input(s): "LIPASE", "AMYLASE" in the last 168 hours. No results for input(s): "AMMONIA" in the last 168 hours. Coagulation Profile: No results for input(s): "INR", "PROTIME" in the last 168 hours. Cardiac Enzymes: Recent Labs  Lab 01/31/22 1521 01/31/22 1727  TROPONINIHS 357* 427*   BNP (last 3 results) No results for input(s): "PROBNP" in the last 8760 hours. HbA1C: No results for input(s): "HGBA1C" in the last 72 hours. CBG: No results for input(s): "GLUCAP" in the last 168 hours. Lipid Profile: No results for input(s): "CHOL", "HDL", "LDLCALC", "TRIG", "CHOLHDL", "LDLDIRECT" in the last 72 hours. Thyroid Function Tests: No results for input(s): "TSH", "T4TOTAL", "FREET4", "T3FREE", "THYROIDAB" in the last 72 hours. Anemia Panel: No results  for input(s): "VITAMINB12", "FOLATE", "FERRITIN", "TIBC", "IRON", "RETICCTPCT" in the last 72 hours. Urine analysis:    Component Value Date/Time   COLORURINE YELLOW 02/07/2019 2000   APPEARANCEUR CLEAR 02/07/2019 2000   LABSPEC 1.011 02/07/2019 2000   PHURINE 6.0 02/07/2019 2000   GLUCOSEU NEGATIVE 02/07/2019 2000   HGBUR NEGATIVE 02/07/2019 2000  BILIRUBINUR NEGATIVE 02/07/2019 2000   KETONESUR NEGATIVE 02/07/2019 2000   PROTEINUR NEGATIVE 02/07/2019 2000   UROBILINOGEN 2.0 (H) 02/27/2013 2100   NITRITE NEGATIVE 02/07/2019 2000   LEUKOCYTESUR NEGATIVE 02/07/2019 2000    Radiological Exams on Admission: I have personally reviewed images CT Angio Chest PE W and/or Wo Contrast  Result Date: 01/31/2022 CLINICAL DATA:  Dyspnea, high probability PE EXAM: CT ANGIOGRAPHY CHEST WITH CONTRAST TECHNIQUE: Multidetector CT imaging of the chest was performed using the standard protocol during bolus administration of intravenous contrast. Multiplanar CT image reconstructions and MIPs were obtained to evaluate the vascular anatomy. RADIATION DOSE REDUCTION: This exam was performed according to the departmental dose-optimization program which includes automated exposure control, adjustment of the mA and/or kV according to patient size and/or use of iterative reconstruction technique. CONTRAST:  34mL OMNIPAQUE IOHEXOL 350 MG/ML SOLN COMPARISON:  Radiographs earlier today FINDINGS: Cardiovascular: Satisfactory opacification of the pulmonary arteries to the segmental level. No evidence of pulmonary embolism. Enlarged right ventricle. No pericardial effusion. Aortic atherosclerotic calcification. Mediastinum/Nodes: Layering secretions along the right lateral trachea. No thoracic adenopathy by size. Lungs/Pleura: Right-greater-than-left airspace opacities likely infectious/inflammatory in nature. Mild scarring in the anterior upper lobes. Lower lobe bronchial wall thickening. Pulmonary cyst in the anterior lingula.  No pleural effusion or pneumothorax. Upper Abdomen: Partially visualized large right renal cyst. No acute abnormality. Musculoskeletal: Thoracic kyphosis. Extravasated contrast within the soft tissues of the visualized left upper extremity. Review of the MIP images confirms the above findings. IMPRESSION: 1. Negative for acute pulmonary embolism. 2. Infectious/inflammatory airspace opacities in the bilateral lower lobes may be due to pneumonia or aspiration pneumonitis. 3. Extravasated contrast within the soft tissues of the visualized left upper extremity. This was called to the ordering clinician or representative by the Radiology Department at the imaging location. Electronically Signed   By: Minerva Fester M.D.   On: 01/31/2022 19:24   CT HEAD WO CONTRAST ( )  Result Date: 01/31/2022 CLINICAL DATA:  Mental status change.  Dementia. EXAM: CT HEAD WITHOUT CONTRAST TECHNIQUE: Contiguous axial images were obtained from the base of the skull through the vertex without intravenous contrast. RADIATION DOSE REDUCTION: This exam was performed according to the departmental dose-optimization program which includes automated exposure control, adjustment of the mA and/or kV according to patient size and/or use of iterative reconstruction technique. COMPARISON:  CT head 12/16/2019 FINDINGS: Brain: Moderate atrophy. Negative for hydrocephalus. Mild white matter hypodensity. Negative for acute infarct, hemorrhage, mass Vascular: Negative for hyperdense vessel Skull: Negative Sinuses/Orbits: Negative Other: None IMPRESSION: No acute abnormality. Generalized atrophy and mild chronic microvascular ischemic change in the white matter Electronically Signed   By: Marlan Palau M.D.   On: 01/31/2022 18:27   DG Chest 2 View  Result Date: 01/31/2022 CLINICAL DATA:  Shortness of breath. EXAM: CHEST - 2 VIEW COMPARISON:  February 07, 2019 FINDINGS: The study is limited secondary to patient rotation. The heart size and mediastinal  contours are within normal limits. The lungs are inflated. There is no evidence of acute infiltrate, pleural effusion or pneumothorax. The visualized skeletal structures are unremarkable. IMPRESSION: COPD without evidence of active cardiopulmonary disease. Electronically Signed   By: Aram Candela M.D.   On: 01/31/2022 15:54    EKG: My personal interpretation of EKG shows: NSR    Assessment/Plan Principal Problem:   Elevated troponin Active Problems:   Seizures (HCC)   Dementia (HCC)   Aspiration pneumonia (HCC)    Assessment and Plan: * Elevated troponin Admit to  observation telemetry bed. Asa 81 mg daily. Check lipid panel. Repeat troponin in AM. Check echo. Cardiology consulted by EDP.  Aspiration pneumonia (HCC) Possible aspiration pna seen on CT. On RA. No leukocytosis. Continue with po augmentin. dtr does not describe any choking or vomiting recently.  Dementia (HCC) Chronic. Lives at home with dtr wanda.  Seizures (HCC) Chronic. Check depakote and dilantin levels. Continue keppra.    DVT prophylaxis: SQ Heparin Code Status: Full Code Family Communication: discussed with dtrs wanda and connie  Disposition Plan: return home  Consults called: EDP has consulted cardiology  Admission status: Observation, Telemetry bed   Carollee Herter, DO [facility] 01/31/2022, 8:21 PM

## 2022-01-31 NOTE — Assessment & Plan Note (Signed)
Chronic. Check depakote and dilantin levels. Continue keppra.

## 2022-01-31 NOTE — ED Provider Notes (Signed)
[facility]-EMERGENCY DEPT Provider Note   CSN: [id number] Arrival date & time: 01/31/22  1449     History  Chief Complaint  Patient presents with   Altered Mental Status   Shortness of Breath    Samuel Kirk is a 87 y.o. male hx of dementia, hypertension, seizure here presenting with shortness of breath.  Patient has been having shortness of breath for the last week or so.  Also has some vague chest pain as well.  Patient was seen in the office and was supposed to get chest xray but was unable to get it this week.  Patient also has decreased appetite patient has been more confused than usual.  Patient is unable to give me much history but history of per her daughters at bedside.   The history is provided by the patient.       Home Medications Prior to Admission medications   Medication Sig Start Date End Date Taking? Authorizing Provider  divalproex (DEPAKOTE) 500 MG DR tablet One tablet in the morning and 2 in the evening 04/27/19   Glean Salvo, NP  donepezil (ARICEPT) 5 MG tablet Take 5 mg by mouth every morning.     [provider]  furosemide (LASIX) 20 MG tablet Take 20 mg by mouth daily as needed. 01/10/20   [provider]  levETIRAcetam (KEPPRA) 500 MG tablet Take 1 tablet twice daily x 2 weeks, then take 1.5 tablets twice daily 04/29/19   Glean Salvo, NP  memantine (NAMENDA) 10 MG tablet TAKE 1 TABLET BY MOUTH TWICE A DAY 03/14/20   Glean Salvo, NP  phenytoin (DILANTIN) 100 MG ER capsule TAKE 1 CAPSULE BY MOUTH TWICE A DAY 05/02/20   Glean Salvo, NP  potassium chloride (K-DUR) 10 MEQ tablet Take 1 tablet (10 mEq total) by mouth daily. 02/07/19   Tilden Fossa, MD      Allergies    Patient has no known allergies.    Review of Systems   Review of Systems  Respiratory:  Positive for shortness of breath.   Cardiovascular:  Positive for chest pain.  All other systems reviewed and are negative.   Physical Exam Updated  Vital Signs BP 123/71   Pulse 66   Temp 98.3 F (36.8 C) (Oral)   Resp (!) 24   Ht 6' (1.829 m)   Wt 45.4 kg   SpO2 100%   BMI 13.56 kg/m  Physical Exam Vitals and nursing note reviewed.  Constitutional:      Comments: Chronically ill and demented   HENT:     Head: Normocephalic.     Mouth/Throat:     Mouth: Mucous membranes are moist.  Eyes:     Extraocular Movements: Extraocular movements intact.     Pupils: Pupils are equal, round, and reactive to light.  Cardiovascular:     Rate and Rhythm: Normal rate and regular rhythm.  Pulmonary:     Effort: Pulmonary effort is normal.     Breath sounds: Normal breath sounds.  Abdominal:     General: Bowel sounds are normal.     Palpations: Abdomen is soft.  Musculoskeletal:        General: Normal range of motion.     Cervical back: Normal range of motion and neck supple.  Skin:    General: Skin is warm.     Capillary Refill: Capillary refill takes less than 2 seconds.  Neurological:     Comments: Demented, moving all extremities.  Psychiatric:        Mood and Affect: Mood normal.     ED Results / Procedures / Treatments   Labs (all labs ordered are listed, but only abnormal results are displayed) Labs Reviewed  BRAIN NATRIURETIC PEPTIDE - Abnormal; Notable for the following components:      Result Value   B Natriuretic Peptide 189.3 (*)    All other components within normal limits  CBC WITH DIFFERENTIAL/PLATELET - Abnormal; Notable for the following components:   RBC 3.75 (*)    Hemoglobin 12.0 (*)    HCT 36.5 (*)    RDW 25.3 (*)    Platelets 809 (*)    nRBC 1.2 (*)    All other components within normal limits  COMPREHENSIVE METABOLIC PANEL - Abnormal; Notable for the following components:   Glucose, Bld 103 (*)    Albumin 3.0 (*)    AST 42 (*)    Anion gap 4 (*)    All other components within normal limits  TROPONIN I (HIGH SENSITIVITY) - Abnormal; Notable for the following components:   Troponin I (High  Sensitivity) 357 (*)    All other components within normal limits  SARS CORONAVIRUS 2 BY RT PCR    EKG EKG Interpretation  Date/Time:  Wednesday January 31 2022 15:22:16 EDT Ventricular Rate:  70 PR Interval:  197 QRS Duration: 103 QT Interval:  426 QTC Calculation: 460 R Axis:   40 Text Interpretation: Sinus rhythm Probable left atrial enlargement Left ventricular hypertrophy Nonspecific T abnrm, anterolateral leads Baseline wander in lead(s) II aVR No significant change since last tracing Confirmed by Richardean Canal [phone]) on 01/31/2022 4:26:08 PM  Radiology DG Chest 2 View  Result Date: 01/31/2022 CLINICAL DATA:  Shortness of breath. EXAM: CHEST - 2 VIEW COMPARISON:  February 07, 2019 FINDINGS: The study is limited secondary to patient rotation. The heart size and mediastinal contours are within normal limits. The lungs are inflated. There is no evidence of acute infiltrate, pleural effusion or pneumothorax. The visualized skeletal structures are unremarkable. IMPRESSION: COPD without evidence of active cardiopulmonary disease. Electronically Signed   By: Aram Candela M.D.   On: 01/31/2022 15:54    Procedures Procedures    CRITICAL CARE Performed by: Richardean Canal   Total critical care time: 30 minutes  Critical care time was exclusive of separately billable procedures and treating other patients.  Critical care was necessary to treat or prevent imminent or life-threatening deterioration.  Critical care was time spent personally by me on the following activities: development of treatment plan with patient and/or surrogate as well as nursing, discussions with consultants, evaluation of patient's response to treatment, examination of patient, obtaining history from patient or surrogate, ordering and performing treatments and interventions, ordering and review of laboratory studies, ordering and review of radiographic studies, pulse oximetry and re-evaluation of patient's  condition.  Angiocath insertion Performed by: Richardean Canal  Consent: Verbal consent obtained. Risks and benefits: risks, benefits and alternatives were discussed Time out: Immediately prior to procedure a "time out" was called to verify the correct patient, procedure, equipment, support staff and site/side marked as required.  Preparation: Patient was prepped and draped in the usual sterile fashion.  Vein Location: R antecube   Ultrasound Guided  Gauge: 20   Normal blood return and flush without difficulty Patient tolerance: Patient tolerated the procedure well with no immediate complications.    Medications Ordered in ED Medications  albuterol (VENTOLIN HFA) 108 (90 Base) MCG/ACT  inhaler 2 puff (has no administration in time range)  aspirin tablet 325 mg (has no administration in time range)    ED Course/ Medical Decision Making/ A&P                           Medical Decision Making Samuel Kirk is a 87 y.o. male history of dementia here presenting with altered mental status and shortness of breath and chest pain.  Consider worsening dementia versus ACS.  Low suspicion for PE.  Plan to get CBC and CMP and troponin  4:37 PM Troponin is elevated at 357.  I discussed the case with Dr. Lynnette Caffey from cardiology.  She reviewed the patient's chart.  She recommend echo and admission to the hospitalist service at [facility].  Cardiology Danarius see patient as a consult.  They recommend holding off heparin for now.  I Zakk get a CTA chest given the elevated troponin to rule out a PE.  7:45 PM CT showed no PE but aspiration pneumonia. Ordered unasyn. Hold off on heparin per cardiology and they recommend echo. Hospitalist to admit    Problems Addressed: Aspiration pneumonia of lower lobe, unspecified aspiration pneumonia type, unspecified laterality [facility]): acute illness or injury Elevated troponin: acute illness or injury  Amount and/or Complexity of Data Reviewed Labs: ordered.  Decision-making details documented in ED Course. Radiology: ordered and independent interpretation performed. Decision-making details documented in ED Course. ECG/medicine tests: ordered and independent interpretation performed. Decision-making details documented in ED Course.  Risk OTC drugs. Prescription drug management. Decision regarding hospitalization.   Final Clinical Impression(s) / ED Diagnoses Final diagnoses:  None    Rx / DC Orders ED Discharge Orders     None         Charlynne Pander, MD 01/31/22 1949

## 2022-01-31 NOTE — Progress Notes (Signed)
Pharmacy Antibiotic Note  Samuel Kirk is a 87 y.o. male admitted on 01/31/2022 with aspiration PNA.  Pharmacy has been consulted for Unasyn dosing.  Plan: Unasyn 3 gm x1 followed by Unasyn 1.5 gm IV q8h Pharmacy to sign off  Height: 6' (182.9 cm) Weight: 45.4 kg (100 lb) IBW/kg (Calculated) : 77.6  Temp (24hrs), Avg:97.8 F (36.6 C), Min:97.2 F (36.2 C), Max:98.3 F (36.8 C)  Recent Labs  Lab 01/31/22 1521  WBC 8.9  CREATININE 0.98    Estimated Creatinine Clearance: 36 mL/min (by C-G formula based on SCr of 0.98 mg/dL).    No Known Allergies Thank you for allowing pharmacy to be a part of this patient's care.   Herby Abraham, Pharm.D 01/31/2022 8:40 PM

## 2022-01-31 NOTE — Subjective & Objective (Signed)
CC: SOB HPI: 87 year old African-American male history of dementia, reported seizures, reports to the ER today with complaints of shortness of breath.  Patient is demented and cannot give any history.  He is here with his daughters Junious Dresser and Burna Mortimer.  Burna Mortimer gives the majority of the history.  3 weeks ago the patient went to go visit his daughter.  His daughter lives in a third floor apartment.  He walked up to her apartment and became short of breath.  His daughter Burna Mortimer took him to the primary care doctor's office due to the patient's complaints of shortness of breath.  Reportedly he was supposed to have a chest x-ray done 3 weeks ago but she never got this done.  Patient's been having increasing shortness of breath.  The daughter Burna Mortimer states the patient has been eating salt and sugar out of the cabinet of her house.  He has dementia.  He opens cabinets and wanders around the house and outside.  He is supposed to have 24-hour supervision at home either by her or her husband but the patient does wander around at night.  Work-up here in the ER shows an elevated troponin I of 357 and then 427.  EKG shows no acute ST changes.  CTPA was negative for PE but did have some bilateral lower lobe airspace opacities.  EDP is discussed the case with cardiology who wanted the patient admitted so they could consult on him.  Patient is on room air with sats of 98 to 100%.  He is afebrile with a temp of 97.2.  [facility] hospital[facility] contacted for admission.

## 2022-01-31 NOTE — Assessment & Plan Note (Signed)
Chronic. Lives at home with dtr wanda.

## 2022-01-31 NOTE — Assessment & Plan Note (Signed)
Possible aspiration pna seen on CT. On RA. No leukocytosis. Continue with po augmentin. dtr does not describe any choking or vomiting recently.

## 2022-01-31 NOTE — Progress Notes (Signed)
A consult was received from an ED physician for Unasyn per pharmacy dosing.  The patient's profile has bee n reviewed for ht/wt/allergies/indication/available labs.   A one time order has been placed for Unasyn 3 gm IV x 1 dose.    Further antibiotics/pharmacy consults should be ordered by admitting physician if indicated.                       Thank you,  Herby Abraham, Pharm.D 01/31/2022 7:51 PM

## 2022-01-31 NOTE — Assessment & Plan Note (Signed)
Admit to observation telemetry bed. Asa 81 mg daily. Check lipid panel. Repeat troponin in AM. Check echo. Cardiology consulted by EDP.

## 2022-01-31 NOTE — ED Provider Triage Note (Signed)
Emergency Medicine Provider Triage Evaluation Note  Samuel Kirk , a 87 y.o. male  was evaluated in triage.  Patient has a history of Alzheimer's dementia and is presenting today due to shortness of breath.  Caretaker says that he has appeared to have an increased work of breathing over the last few days.  PCP wanted him to get a chest x-ray.  She also notes that he has been more confused than recently. Review of Systems  Positive: Level 5 caveat Negative: Level 5 caveat  Physical Exam  BP 119/75   Pulse 73   Temp 98.3 F (36.8 C) (Oral)   Resp 18   SpO2 97%  Gen:   Awake, no distress   Resp:  Normal effort  MSK:   Moves extremities without difficulty  Other:  Bilateral pitting edema.  RRR, lung sounds clear  Medical Decision Making  Medically screening exam initiated at 3:06 PM.  Appropriate orders placed.  Samuel Kirk was informed that the remainder of the evaluation Adryel be completed by another provider, this initial triage assessment does not replace that evaluation, and the importance of remaining in the ED until their evaluation is complete.     Saddie Benders, PA-C 01/31/22 1507

## 2022-01-31 NOTE — ED Triage Notes (Signed)
Daughter states patient has been having episodes of sob. Pt has some dementia, currently oriented to name, dob, and location. Daughter states he is a little more confused than normal.

## 2022-02-01 ENCOUNTER — Observation Stay (HOSPITAL_BASED_OUTPATIENT_CLINIC_OR_DEPARTMENT_OTHER): Payer: Medicare Other

## 2022-02-01 DIAGNOSIS — R079 Chest pain, unspecified: Secondary | ICD-10-CM | POA: Diagnosis not present

## 2022-02-01 DIAGNOSIS — F0390 Unspecified dementia without behavioral disturbance: Secondary | ICD-10-CM | POA: Diagnosis not present

## 2022-02-01 DIAGNOSIS — I10 Essential (primary) hypertension: Secondary | ICD-10-CM | POA: Diagnosis not present

## 2022-02-01 DIAGNOSIS — R569 Unspecified convulsions: Secondary | ICD-10-CM | POA: Diagnosis not present

## 2022-02-01 DIAGNOSIS — J690 Pneumonitis due to inhalation of food and vomit: Secondary | ICD-10-CM | POA: Diagnosis not present

## 2022-02-01 DIAGNOSIS — E43 Unspecified severe protein-calorie malnutrition: Secondary | ICD-10-CM | POA: Diagnosis not present

## 2022-02-01 DIAGNOSIS — Z20822 Contact with and (suspected) exposure to covid-19: Secondary | ICD-10-CM | POA: Diagnosis not present

## 2022-02-01 DIAGNOSIS — R778 Other specified abnormalities of plasma proteins: Secondary | ICD-10-CM | POA: Diagnosis not present

## 2022-02-01 DIAGNOSIS — G301 Alzheimer's disease with late onset: Secondary | ICD-10-CM | POA: Diagnosis not present

## 2022-02-01 LAB — CBC WITH DIFFERENTIAL/PLATELET
Abs Immature Granulocytes: 0.02 10*3/uL (ref 0.00–0.07)
Basophils Absolute: 0.1 10*3/uL (ref 0.0–0.1)
Basophils Relative: 1 %
Eosinophils Absolute: 0.1 10*3/uL (ref 0.0–0.5)
Eosinophils Relative: 2 %
HCT: 30.2 % — ABNORMAL LOW (ref 39.0–52.0)
Hemoglobin: 10 g/dL — ABNORMAL LOW (ref 13.0–17.0)
Immature Granulocytes: 0 %
Lymphocytes Relative: 18 %
Lymphs Abs: 1.3 10*3/uL (ref 0.7–4.0)
MCH: 31.5 pg (ref 26.0–34.0)
MCHC: 33.1 g/dL (ref 30.0–36.0)
MCV: 95.3 fL (ref 80.0–100.0)
Monocytes Absolute: 0.6 10*3/uL (ref 0.1–1.0)
Monocytes Relative: 8 %
Neutro Abs: 5.3 10*3/uL (ref 1.7–7.7)
Neutrophils Relative %: 71 %
Platelets: 737 10*3/uL — ABNORMAL HIGH (ref 150–400)
RBC: 3.17 MIL/uL — ABNORMAL LOW (ref 4.22–5.81)
RDW: 24.8 % — ABNORMAL HIGH (ref 11.5–15.5)
WBC: 7.4 10*3/uL (ref 4.0–10.5)
nRBC: 1.5 % — ABNORMAL HIGH (ref 0.0–0.2)

## 2022-02-01 LAB — COMPREHENSIVE METABOLIC PANEL WITH GFR
ALT: 13 U/L (ref 0–44)
AST: 22 U/L (ref 15–41)
Albumin: 2.6 g/dL — ABNORMAL LOW (ref 3.5–5.0)
Alkaline Phosphatase: 59 U/L (ref 38–126)
Anion gap: 7 (ref 5–15)
BUN: 15 mg/dL (ref 8–23)
CO2: 26 mmol/L (ref 22–32)
Calcium: 8.5 mg/dL — ABNORMAL LOW (ref 8.9–10.3)
Chloride: 105 mmol/L (ref 98–111)
Creatinine, Ser: 0.7 mg/dL (ref 0.61–1.24)
GFR, Estimated: >60 mL/min
Glucose, Bld: 90 mg/dL (ref 70–99)
Potassium: 3.6 mmol/L (ref 3.5–5.1)
Sodium: 138 mmol/L (ref 135–145)
Total Bilirubin: 0.6 mg/dL (ref 0.3–1.2)
Total Protein: 6.2 g/dL — ABNORMAL LOW (ref 6.5–8.1)

## 2022-02-01 LAB — MAGNESIUM: Magnesium: 1.9 mg/dL (ref 1.7–2.4)

## 2022-02-01 LAB — TSH: TSH: 1.129 u[IU]/mL (ref 0.350–4.500)

## 2022-02-01 LAB — HEPARIN LEVEL (UNFRACTIONATED): Heparin Unfractionated: <0.1 [IU]/mL — ABNORMAL LOW (ref 0.30–0.70)

## 2022-02-01 LAB — TROPONIN I (HIGH SENSITIVITY): Troponin I (High Sensitivity): 335 ng/L

## 2022-02-01 LAB — ECHOCARDIOGRAM COMPLETE
Area-P 1/2: 3.24 cm2
Calc EF: 75.1 %
Height: 72 in
S' Lateral: 1.65 cm
Single Plane A2C EF: 67.2 %
Single Plane A4C EF: 80.7 %
Weight: 1576 [oz_av]

## 2022-02-01 LAB — VALPROIC ACID LEVEL: Valproic Acid Lvl: 35 ug/mL — ABNORMAL LOW (ref 50.0–100.0)

## 2022-02-01 LAB — PHENYTOIN LEVEL, TOTAL: Phenytoin Lvl: 2.8 ug/mL — ABNORMAL LOW (ref 10.0–20.0)

## 2022-02-01 MED ORDER — ADULT MULTIVITAMIN W/MINERALS CH
1.0000 | ORAL_TABLET | Freq: Every day | ORAL | Status: DC
  Administered 2022-02-01 – 2022-02-02 (×2): 1 via ORAL
  Filled 2022-02-01 (×2): qty 1

## 2022-02-01 MED ORDER — PHENYTOIN SODIUM EXTENDED 100 MG PO CAPS
200.0000 mg | ORAL_CAPSULE | Freq: Every day | ORAL | Status: DC
  Administered 2022-02-01: 200 mg via ORAL
  Filled 2022-02-01: qty 2

## 2022-02-01 MED ORDER — PHENYTOIN SODIUM EXTENDED 100 MG PO CAPS
100.0000 mg | ORAL_CAPSULE | Freq: Every day | ORAL | Status: DC
  Administered 2022-02-01 – 2022-02-02 (×2): 100 mg via ORAL
  Filled 2022-02-01 (×2): qty 1

## 2022-02-01 MED ORDER — ENSURE ENLIVE PO LIQD
237.0000 mL | Freq: Three times a day (TID) | ORAL | Status: DC
  Administered 2022-02-01 – 2022-02-02 (×3): 237 mL via ORAL

## 2022-02-01 NOTE — Progress Notes (Signed)
  Echocardiogram 2D Echocardiogram has been performed.  Samuel Kirk 02/01/2022, 2:34 PM

## 2022-02-01 NOTE — Progress Notes (Signed)
Initial Nutrition Assessment  DOCUMENTATION CODES:   Severe malnutrition in context of chronic illness, Underweight  INTERVENTION:   -Ensure Plus High Protein po TID, each supplement provides 350 kcal and 20 grams of protein.   -Multivitamin with minerals daily  NUTRITION DIAGNOSIS:   Severe Malnutrition related to chronic illness (dementia) as evidenced by severe fat depletion, severe muscle depletion.  GOAL:   Patient Ottie meet greater than or equal to 90% of their needs  MONITOR:   PO intake, Supplement acceptance, Labs, Weight trends, I & O's  REASON FOR ASSESSMENT:   Malnutrition Screening Tool    ASSESSMENT:   87 year old male with a history of dementia, reported seizures came to ED with complaints of worsening shortness of breath.  Patient history of dementia.  Patient in room, no family present at time of visit. Pt pretty confused, alert/oriented x 1, not a good historian. Pt drinking an Ensure. States he likes them.  PO intakes: 100%  No weight loss noted in weight records. Pt's family reported 28 lbs  of weight loss.  Medications reviewed.  Labs reviewed.  NUTRITION - FOCUSED PHYSICAL EXAM:  Flowsheet Row Most Recent Value  Orbital Region Severe depletion  Upper Arm Region Severe depletion  Thoracic and Lumbar Region Severe depletion  Buccal Region Severe depletion  Temple Region Severe depletion  Clavicle Bone Region Severe depletion  Clavicle and Acromion Bone Region Severe depletion  Scapular Bone Region Severe depletion  Dorsal Hand Severe depletion  Patellar Region Severe depletion  Anterior Thigh Region Severe depletion  Posterior Calf Region Severe depletion  Edema (RD Assessment) Moderate  [mild-moderate BLE]  Hair Reviewed  Eyes Reviewed  [left eye more closed]  Mouth Reviewed  [no teeth]  Skin Reviewed       Diet Order:   Diet Order             Diet Heart Room service appropriate? Yes; Fluid consistency: Thin  Diet effective  now                   EDUCATION NEEDS:   Not appropriate for education at this time  Skin:  Skin Assessment: Reviewed RN Assessment  Last BM:  PTA  Height:   Ht Readings from Last 1 Encounters:  01/31/22 6' (1.829 m)    Weight:   Wt Readings from Last 1 Encounters:  02/01/22 44.7 kg    BMI:  Body mass index is 13.36 kg/m.  Estimated Nutritional Needs:   Kcal:  2000-2200  Protein:  100-110g  Fluid:  2L/day  Tilda Franco, MS, RD, LDN Inpatient Clinical Dietitian Contact information available via Amion

## 2022-02-01 NOTE — Progress Notes (Signed)
2D echo attempted, but patient in chair. Try later per RN

## 2022-02-01 NOTE — Progress Notes (Signed)
[facility]  PROGRESS NOTE  Shivan Mutschler WUJ:[id number] DOB: 17-Sep-1934 DOA: 01/31/2022 PCP: Fleet Contras, MD   Brief HPI:   87 year old male with a history of dementia, reported seizures came to ED with complaints of worsening shortness of breath.  Patient history of dementia.  In the ED he was found to have elevated troponin 357, 427.  EKG showed no acute changes.  CTA chest was negative for pulmonary embolism.  It showed bilateral opacities.  Patient was started on antibiotics for aspiration pneumonia.    Subjective   Patient seen and examined, denies shortness of breath this morning.  Appreciate cardiology consultation.   Assessment/Plan:    Troponin elevation -As per cardiology patient is not a candidate for aggressive intervention. -Echocardiogram obtained; result is pending -Was not started on IV heparin per cardiology  Aspiration pneumonia -Patient has possible aspiration seen on CT chest -Started on IV Unasyn  Dementia -Stable -No behavior disturbance  Seizures -Continue Depakote, phenytoin -Dilantin level is 2.8; as per neurology note patient has been maintained on a prior low levels with low-dose with dose titrated for seizure control -Continue Depakote 100 mg q. morning and 200 mg at night -I called and confirmed with patient's pharmacy, patient is not taking Keppra; Keppra has been discontinued    Medications     aspirin EC  81 mg Oral Daily   divalproex  1,000 mg Oral QHS   divalproex  500 mg Oral Daily   donepezil  5 mg Oral q morning   feeding supplement  237 mL Oral BID BM   heparin  5,000 Units Subcutaneous Q8H   phenytoin  100 mg Oral Daily   phenytoin  200 mg Oral QHS     Data Reviewed:   CBG:  No results for input(s): "GLUCAP" in the last 168 hours.  SpO2: (!) 88 %    Vitals:   02/01/22 0153 02/01/22 0500 02/01/22 0602 02/01/22 1324  BP: 114/73  111/67 (!) 104/59  Pulse: 66  60 72  Resp: 20  18 20   Temp: 97.6 F (36.4 C)   98.4 F (36.9 C) 98 F (36.7 C)  TempSrc:    Oral  SpO2: 94%  100% (!) 88%  Weight:  44.7 kg    Height:          Data Reviewed:  Basic Metabolic Panel: Recent Labs  Lab 01/31/22 1521 02/01/22 0331  NA 137 138  K 4.7 3.6  CL 109 105  CO2 24 26  GLUCOSE 103* 90  BUN 17 15  CREATININE 0.98 0.70  CALCIUM 9.0 8.5*  MG  --  1.9    CBC: Recent Labs  Lab 01/31/22 1521 02/01/22 0331  WBC 8.9 7.4  NEUTROABS 6.4 5.3  HGB 12.0* 10.0*  HCT 36.5* 30.2*  MCV 97.3 95.3  PLT 809* 737*    LFT Recent Labs  Lab 01/31/22 1521 02/01/22 0331  AST 42* 22  ALT 16 13  ALKPHOS 68 59  BILITOT 0.5 0.6  PROT 7.5 6.2*  ALBUMIN 3.0* 2.6*     Antibiotics: Anti-infectives (From admission, onward)    Start     Dose/Rate Route Frequency Ordered Stop   02/01/22 0400  ampicillin-sulbactam (UNASYN) 1.5 g in sodium chloride 0.9 % 100 mL IVPB        1.5 g 200 mL/hr over 30 Minutes Intravenous Every 8 hours 01/31/22 2037     01/31/22 2000  Ampicillin-Sulbactam (UNASYN) 3 g in sodium chloride 0.9 % 100 mL IVPB  3 g 200 mL/hr over 30 Minutes Intravenous  Once 01/31/22 1950 01/31/22 2039        DVT prophylaxis: Heparin  Code Status: Full code  Family Communication:    CONSULTS cardiology   Objective    Physical Examination:  General-appears in no acute distress Heart-S1-S2, regular, no murmur auscultated Lungs-clear to auscultation bilaterally, no wheezing or crackles auscultated Abdomen-soft, nontender, no organomegaly Extremities-no edema in the lower extremities Neuro-alert, oriented x3, no focal deficit noted  Status is: Inpatient: Troponin elevation           Baruch Lewers S Edwardine Deschepper   [facility] If 7PM-7AM, please contact night-coverage at [email or web address], Office  [phone]   02/01/2022, 1:43 PM  LOS: 0 days

## 2022-02-01 NOTE — TOC Initial Note (Signed)
Transition of Care [facility]) - Initial/Assessment Note    Patient Details  Name: Samuel Kirk MRN: [id number] Date of Birth: July 14, 1934  Transition of [facility]) CM/SW Contact:    Golda Acre, RN Phone Number: 02/01/2022, 8:15 AM  Clinical Narrative:                 Patient lives in a three story apartment, recently got short of breath going up the stairs.  Has hx of dementia but lives alone with the help of the daughters.  For past three week has been living with the daughter wanda who is suppose to watch him due to night wanderings. Aries follow for possible placement or hhc needs.  Expected Discharge Plan: Home w Home Health Services Barriers to Discharge: Continued Medical Work up   Patient Goals and CMS Choice Patient states their goals for this hospitalization and ongoing recovery are:: daughters-to return to to home with wanda who he hasd been living with CMS Medicare.gov Compare Post Acute Care list provided to:: Patient Represenative (must comment) (daughters)    Expected Discharge Plan and Services Expected Discharge Plan: Home w Home Health Services   Discharge Planning Services: CM Consult   Living arrangements for the past 2 months: Single Family Home                                      Prior Living Arrangements/Services Living arrangements for the past 2 months: Single Family Home Lives with:: Self, Adult Children (livesd normally in an apartment by himself for the past three weeks has lived with the daughter wanda and her husband) Patient language and need for interpreter reviewed:: Yes Do you feel safe going back to the place where you live?: Yes            Criminal Activity/Legal Involvement Pertinent to Current Situation/Hospitalization: No - Comment as needed  Activities of Daily Living Home Assistive Devices/Equipment: Cane (specify quad or straight) ADL Screening (condition at time of admission) Patient's cognitive ability adequate to safely  complete daily activities?: No Is the patient deaf or have difficulty hearing?: Yes Does the patient have difficulty seeing, even when wearing glasses/contacts?: Yes Does the patient have difficulty concentrating, remembering, or making decisions?: Yes Patient able to express need for assistance with ADLs?: Yes Does the patient have difficulty dressing or bathing?: Yes Independently performs ADLs?: No Communication: Independent Dressing (OT): Needs assistance Is this a change from baseline?: Pre-admission baseline Grooming: Independent Feeding: Independent Bathing: Needs assistance Is this a change from baseline?: Pre-admission baseline Toileting: Needs assistance Is this a change from baseline?: Pre-admission baseline In/Out Bed: Needs assistance Is this a change from baseline?: Pre-admission baseline Walks in Home: Needs assistance Is this a change from baseline?: Pre-admission baseline Does the patient have difficulty walking or climbing stairs?: Yes Weakness of Legs: Both Weakness of Arms/Hands: Both  Permission Sought/Granted                  Emotional Assessment Appearance:: Appears stated age Attitude/Demeanor/Rapport: Inconsistent Affect (typically observed): Unable to Assess Orientation: : Oriented to Place, Oriented to Situation, Oriented to Self Alcohol / Substance Use: Never Used Psych Involvement: No (comment)  Admission diagnosis:  Elevated troponin [R77.8] Aspiration pneumonia of lower lobe, unspecified aspiration pneumonia type, unspecified laterality [facility]) [J69.0] Patient Active Problem List   Diagnosis Date Noted   Elevated troponin 01/31/2022   Aspiration pneumonia (HCC) 01/31/2022  Seizures (HCC) 02/25/2019   Dementia (HCC) 02/25/2019   Gait abnormality 02/25/2019   Left carotid bruit 02/25/2019   PCP:  Fleet Contras, MD Pharmacy:   [phone], [place] - [address] [place]  [address] Phone: [phone] Fax: [phone]  [id number] - [place] [address], [place] - [address] AT [facility] [address] [place] [address] Phone: [phone] Fax: [phone]  [facility] - [place], [place] - [address] [place] [address] Phone: [phone] Fax: [phone]     Social Determinants of Health (SDOH) Interventions    Readmission Risk Interventions     No data to display

## 2022-02-01 NOTE — Consult Note (Signed)
Cardiology Consultation:   Patient ID: Samuel Kirk MRN: [id number]; DOB: Jul 31, 1934  Admit date: 01/31/2022 Date of Consult: 02/01/2022  PCP:  Fleet Contras, MD   [facility] Providers Cardiologist:  None      Patient Profile:   Samuel Kirk is a 87 y.o. male with a hx of dementia, HTN, seizures who is being seen 02/01/2022 for the evaluation of dyspnea, elevated troponin at the request of Dr. Sharl Ma.  History of Present Illness:   Samuel Kirk is an 87 year old male with above medical history.  Per chart review, patient does not appear to have seen a cardiologist in the past.  Patient lives with his daughter Burna Mortimer who is his primary caretaker.  Reportedly patient is supposed to have 24-hour supervision at home by Shirley or her husband.  Patient was brought into the ED on 8/2 by his daughter.  Daughter reported that the patient had been having episodes of shortness of breath for the past 3 weeks.  Daughter had taken the patient to his PCP for evaluation, recommended chest x ray that has not been completed.   Labs in the ED showed Na 137, K 4.7, creatinine 0.98, WBC 8.9, hemoglobin 12.0, platelets 809. BNP elevated to 189.3. hsTn 357>>427.   CXR showed COPD without evidence of active cardiopulmonary disease. EKG showed sinus rhythm with LVH. CTA chest showed no acute PE, infectious/inflammatory airspace opacities in the bilateral lower lobes that may be due to PNA or aspiration PNA.   On interview, patient is demented and unable to provide an accurate history. He denies being in any pain. Denies chest pain, sob, cough.   Past Medical History:  Diagnosis Date   Dementia (HCC)    Gait abnormality 02/25/2019   Left carotid bruit 02/25/2019   Seizures (HCC)     Past Surgical History:  Procedure Laterality Date   FOOT SURGERY       Home Medications:  Prior to Admission medications   Medication Sig Start Date End Date Taking? Authorizing Provider  divalproex (DEPAKOTE) 500 MG DR tablet  One tablet in the morning and 2 in the evening Patient taking differently: Take 1,500 mg by mouth at bedtime. 04/27/19  Yes Glean Salvo, NP  donepezil (ARICEPT) 5 MG tablet Take 5 mg by mouth every morning.    Yes [provider]  furosemide (LASIX) 20 MG tablet Take 20 mg by mouth daily as needed for edema. 01/10/20  Yes [provider]  Iron-FA-B Cmp-C-Biot-Probiotic (FUSION PLUS) CAPS Take 1 capsule by mouth daily.   Yes [provider]  phenytoin (DILANTIN) 100 MG ER capsule TAKE 1 CAPSULE BY MOUTH TWICE A DAY Patient taking differently: Take 100-200 mg by mouth See admin instructions. Take 1 capsule in the morning then take 2 capsules in the evening 05/02/20  Yes Glean Salvo, NP  levETIRAcetam (KEPPRA) 500 MG tablet Take 1 tablet twice daily x 2 weeks, then take 1.5 tablets twice daily Patient not taking: Reported on 02/01/2022 04/29/19   Glean Salvo, NP  memantine (NAMENDA) 10 MG tablet TAKE 1 TABLET BY MOUTH TWICE A DAY Patient not taking: Reported on 02/01/2022 03/14/20   Glean Salvo, NP  potassium chloride (K-DUR) 10 MEQ tablet Take 1 tablet (10 mEq total) by mouth daily. Patient not taking: Reported on 02/01/2022 02/07/19   Tilden Fossa, MD    Inpatient Medications: Scheduled Meds:  aspirin EC  81 mg Oral Daily   divalproex  1,000 mg Oral QHS   divalproex  500 mg Oral Daily   donepezil  5 mg Oral q morning   feeding supplement  237 mL Oral BID BM   heparin  5,000 Units Subcutaneous Q8H   phenytoin  100 mg Oral Daily   phenytoin  200 mg Oral QHS   Continuous Infusions:  ampicillin-sulbactam (UNASYN) IV 1.5 g (02/01/22 0454)   PRN Meds: acetaminophen **OR** acetaminophen, albuterol, ondansetron **OR** ondansetron (ZOFRAN) IV  Allergies:   No Known Allergies  Social History:   Social History   Socioeconomic History   Marital status: Married    Spouse name: Not on file   Number of children: 2   Years of education: 7th grade   Highest  education level: Not on file  Occupational History   Occupation: Retired  Tobacco Use   Smoking status: Never   Smokeless tobacco: Current    Types: Chew  Substance and Sexual Activity   Alcohol use: No   Drug use: No   Sexual activity: Not on file  Other Topics Concern   Not on file  Social History Narrative   Lives at home with his daughter.   Left-handed.   No daily caffeine use.         Social Determinants of Health   Financial Resource Strain: Not on file  Food Insecurity: Not on file  Transportation Needs: Not on file  Physical Activity: Not on file  Stress: Not on file  Social Connections: Not on file  Intimate Partner Violence: Not on file    Family History:    Family History  Problem Relation Age of Onset   Dementia Mother        died at age 81   Other Father        unsure of history - died at age 89     ROS:  Please see the history of present illness.   All other ROS reviewed and negative.     Physical Exam/Data:   Vitals:   01/31/22 2157 02/01/22 0153 02/01/22 0500 02/01/22 0602  BP: 108/78 114/73  111/67  Pulse: 60 66  60  Resp: 18 20  18   Temp: 97.9 F (36.6 C) 97.6 F (36.4 C)  98.4 F (36.9 C)  TempSrc:      SpO2: 95% 94%  100%  Weight:   44.7 kg   Height:        Intake/Output Summary (Last 24 hours) at 02/01/2022 0919 Last data filed at 02/01/2022 0200 Gross per 24 hour  Intake 450 ml  Output --  Net 450 ml      02/01/2022    5:00 AM 01/31/2022    3:06 PM 04/27/2019    9:51 AM  Last 3 Weights  Weight (lbs) 98 lb 8 oz 100 lb 116 lb 6.4 oz  Weight (kg) 44.679 kg 45.36 kg 52.799 kg     Body mass index is 13.36 kg/m.  General:  Frail, thin elderly gentleman, sitting comfortably in the chair in no acute distress  HEENT: normal Neck: no JVD Vascular: Radial pulses 2+ bilaterally Cardiac:  normal S1, S2; RRR; no murmur  Lungs:  clear to auscultation bilaterally, no wheezing, rhonchi or rales  Abd: soft, nontender, no hepatomegaly   Ext: no edema Musculoskeletal:  No deformities, BUE and BLE strength normal and equal Skin: warm and dry  Neuro:  CNs 2-12 intact, no focal abnormalities noted Psych:  Normal affect   EKG:  The EKG was personally reviewed and demonstrates:  Sinus rhythm, LVH Telemetry:  Telemetry was personally reviewed and demonstrates:  Sinus rhythm  Relevant CV Studies:   Laboratory Data:  High Sensitivity Troponin:   Recent Labs  Lab 01/31/22 1521 01/31/22 1727  TROPONINIHS 357* 427*     Chemistry Recent Labs  Lab 01/31/22 1521 02/01/22 0331  NA 137 138  K 4.7 3.6  CL 109 105  CO2 24 26  GLUCOSE 103* 90  BUN 17 15  CREATININE 0.98 0.70  CALCIUM 9.0 8.5*  MG  --  1.9  GFRNONAA >60 >60  ANIONGAP 4* 7    Recent Labs  Lab 01/31/22 1521 02/01/22 0331  PROT 7.5 6.2*  ALBUMIN 3.0* 2.6*  AST 42* 22  ALT 16 13  ALKPHOS 68 59  BILITOT 0.5 0.6   Lipids  Recent Labs  Lab 01/31/22 1727  CHOL 112  TRIG 43  HDL 47  LDLCALC 56  CHOLHDL 2.4    Hematology Recent Labs  Lab 01/31/22 1521 02/01/22 0331  WBC 8.9 7.4  RBC 3.75* 3.17*  HGB 12.0* 10.0*  HCT 36.5* 30.2*  MCV 97.3 95.3  MCH 32.0 31.5  MCHC 32.9 33.1  RDW 25.3* 24.8*  PLT 809* 737*   Thyroid  Recent Labs  Lab 02/01/22 0331  TSH 1.129    BNP Recent Labs  Lab 01/31/22 1509  BNP 189.3*    DDimer No results for input(s): "DDIMER" in the last 168 hours.   Radiology/Studies:  CT Angio Chest PE W and/or Wo Contrast  Result Date: 01/31/2022 CLINICAL DATA:  Dyspnea, high probability PE EXAM: CT ANGIOGRAPHY CHEST WITH CONTRAST TECHNIQUE: Multidetector CT imaging of the chest was performed using the standard protocol during bolus administration of intravenous contrast. Multiplanar CT image reconstructions and MIPs were obtained to evaluate the vascular anatomy. RADIATION DOSE REDUCTION: This exam was performed according to the departmental dose-optimization program which includes automated exposure control,  adjustment of the mA and/or kV according to patient size and/or use of iterative reconstruction technique. CONTRAST:  18mL OMNIPAQUE IOHEXOL 350 MG/ML SOLN COMPARISON:  Radiographs earlier today FINDINGS: Cardiovascular: Satisfactory opacification of the pulmonary arteries to the segmental level. No evidence of pulmonary embolism. Enlarged right ventricle. No pericardial effusion. Aortic atherosclerotic calcification. Mediastinum/Nodes: Layering secretions along the right lateral trachea. No thoracic adenopathy by size. Lungs/Pleura: Right-greater-than-left airspace opacities likely infectious/inflammatory in nature. Mild scarring in the anterior upper lobes. Lower lobe bronchial wall thickening. Pulmonary cyst in the anterior lingula. No pleural effusion or pneumothorax. Upper Abdomen: Partially visualized large right renal cyst. No acute abnormality. Musculoskeletal: Thoracic kyphosis. Extravasated contrast within the soft tissues of the visualized left upper extremity. Review of the MIP images confirms the above findings. IMPRESSION: 1. Negative for acute pulmonary embolism. 2. Infectious/inflammatory airspace opacities in the bilateral lower lobes may be due to pneumonia or aspiration pneumonitis. 3. Extravasated contrast within the soft tissues of the visualized left upper extremity. This was called to the ordering clinician or representative by the Radiology Department at the imaging location. Electronically Signed   By: Minerva Fester M.D.   On: 01/31/2022 19:24   CT HEAD WO CONTRAST ( )  Result Date: 01/31/2022 CLINICAL DATA:  Mental status change.  Dementia. EXAM: CT HEAD WITHOUT CONTRAST TECHNIQUE: Contiguous axial images were obtained from the base of the skull through the vertex without intravenous contrast. RADIATION DOSE REDUCTION: This exam was performed according to the departmental dose-optimization program which includes automated exposure control, adjustment of the mA and/or kV according to  patient size and/or use of iterative reconstruction  technique. COMPARISON:  CT head 12/16/2019 FINDINGS: Brain: Moderate atrophy. Negative for hydrocephalus. Mild white matter hypodensity. Negative for acute infarct, hemorrhage, mass Vascular: Negative for hyperdense vessel Skull: Negative Sinuses/Orbits: Negative Other: None IMPRESSION: No acute abnormality. Generalized atrophy and mild chronic microvascular ischemic change in the white matter Electronically Signed   By: Marlan Palau M.D.   On: 01/31/2022 18:27   DG Chest 2 View  Result Date: 01/31/2022 CLINICAL DATA:  Shortness of breath. EXAM: CHEST - 2 VIEW COMPARISON:  February 07, 2019 FINDINGS: The study is limited secondary to patient rotation. The heart size and mediastinal contours are within normal limits. The lungs are inflated. There is no evidence of acute infiltrate, pleural effusion or pneumothorax. The visualized skeletal structures are unremarkable. IMPRESSION: COPD without evidence of active cardiopulmonary disease. Electronically Signed   By: Aram Candela M.D.   On: 01/31/2022 15:54     Assessment and Plan:   Elevated Troponin  - hsTn 357>>427 yesterday afternoon, down to 335 this AM - Given the fairly flat trend, suspect trop elevation is secondary to PNA. Hold off on heparin  - Echo this admission pending-- follow, further workup/treatment pending results. Regardless of what echo shows, I do not think patient is a candidate for heart catheterization given his level of dementia, but we may start medical management if needed     - Lipid panel showed LDL 56, HDL 47, triglycerides 43   SOB Aspiration PNA  - Daughter reports that patient has been sob on exertion for about 3 weeks  - CT showed possible PNA vs aspiration PNA. On Abx per primary team   Risk Assessment/Risk Scores:          For questions or updates, please contact [facility] Please consult [email or web address] for contact info under    Signed, Jonita Albee, PA-C  02/01/2022 9:19 AM

## 2022-02-02 DIAGNOSIS — E43 Unspecified severe protein-calorie malnutrition: Secondary | ICD-10-CM | POA: Insufficient documentation

## 2022-02-02 DIAGNOSIS — R778 Other specified abnormalities of plasma proteins: Secondary | ICD-10-CM | POA: Diagnosis not present

## 2022-02-02 DIAGNOSIS — G301 Alzheimer's disease with late onset: Secondary | ICD-10-CM | POA: Diagnosis not present

## 2022-02-02 DIAGNOSIS — J690 Pneumonitis due to inhalation of food and vomit: Secondary | ICD-10-CM | POA: Diagnosis not present

## 2022-02-02 MED ORDER — AMOXICILLIN-POT CLAVULANATE 875-125 MG PO TABS: 1.0000 | ORAL_TABLET | Freq: Two times a day (BID) | ORAL | 0 refills | Status: AC

## 2022-02-02 MED ORDER — ENSURE ENLIVE PO LIQD: 237.0000 mL | Freq: Three times a day (TID) | ORAL | 12 refills | Status: AC

## 2022-02-02 NOTE — Discharge Summary (Signed)
Physician Discharge Summary   Patient: Samuel Kirk MRN: [id number] DOB: [birth year]  Admit date:     01/31/2022  Discharge date: 02/02/22  Discharge Physician: Meredeth Ide   PCP: Samuel Contras, MD   Recommendations at discharge:   Patient Samuel Kirk be discharged on Augmentin 1 tablet p.o. twice daily for 5 more days Follow-up PCP in 2 weeks  Discharge Diagnoses: Principal Problem:   Elevated troponin Active Problems:   Seizures (HCC)   Dementia (HCC)   Aspiration pneumonia (HCC)   Protein-calorie malnutrition, severe  Resolved Problems:   * No resolved hospital problems. *  Hospital Course: 87 year old male with a history of dementia, reported seizures came to ED with complaints of worsening shortness of breath.  Patient history of dementia.  In the ED he was found to have elevated troponin 357, 427.  EKG showed no acute changes.  CTA chest was negative for pulmonary embolism.  It showed bilateral opacities.  Patient was started on antibiotics for aspiration pneumonia.  Assessment and Plan:  Troponin elevation -As per cardiology patient is not a candidate for aggressive intervention. -Echocardiogram obtained; result is pending -Was not started on IV heparin per cardiology -It was felt due to demand ischemia and not NSTEMI -Echocardiogram showed EF of 65 to 70 percent, there was no wall motion abnormality, normal left ventricle. -Cardiology signed off, no further intervention recommended   Aspiration pneumonia -Patient has possible aspiration seen on CT chest -Started on IV Unasyn -We [name] discharge on Augmentin 1 tablet p.o. twice daily for 5 more days   Dementia -Stable -No behavior disturbance   Seizures -Continue Depakote, phenytoin -Dilantin level is 2.8; as per neurology note patient has been maintained on a prior low levels with low-dose with dose titrated for seizure control -Continue Depakote 100 mg q. morning and 200 mg at night -I called and confirmed with  patient's pharmacy, patient is not taking Keppra; Keppra has been discontinued  Protein calorie malnutrition -Continue feeding supplements       Consultants: Cardiology Procedures performed:  Disposition: Home Diet recommendation:  Discharge Diet Orders (From admission, onward)     Start     Ordered   02/02/22 0000  Diet - low sodium heart healthy        02/02/22 1509           Cardiac diet DISCHARGE MEDICATION: Allergies as of 02/02/2022   No Known Allergies      Medication List     STOP taking these medications    levETIRAcetam 500 MG tablet Commonly known as: Keppra   potassium chloride 10 MEQ tablet Commonly known as: KLOR-CON       TAKE these medications    amoxicillin-clavulanate 875-125 MG tablet Commonly known as: AUGMENTIN Take 1 tablet by mouth 2 (two) times daily.   divalproex 500 MG DR tablet Commonly known as: DEPAKOTE One tablet in the morning and 2 in the evening What changed:  how much to take how to take this when to take this additional instructions   donepezil 5 MG tablet Commonly known as: ARICEPT Take 5 mg by mouth every morning.   furosemide 20 MG tablet Commonly known as: LASIX Take 20 mg by mouth daily as needed for edema.   Fusion Plus Caps Take 1 capsule by mouth daily.   levocetirizine 5 MG tablet Commonly known as: XYZAL Take 5 mg by mouth every evening.   Linzess 72 MCG capsule Generic drug: linaclotide Take 72 mcg by mouth daily before breakfast.  melatonin 5 MG Tabs Take 5 mg by mouth at bedtime.   memantine 10 MG tablet Commonly known as: NAMENDA TAKE 1 TABLET BY MOUTH TWICE A DAY   mirtazapine 7.5 MG tablet Commonly known as: REMERON Take 7.5 mg by mouth at bedtime.   phenytoin 100 MG ER capsule Commonly known as: DILANTIN TAKE 1 CAPSULE BY MOUTH TWICE A DAY What changed:  how much to take when to take this additional instructions               Durable Medical Equipment  (From  admission, onward)           Start     Ordered   02/01/22 1328  For home use only DME 4 wheeled rolling walker with seat  Once       Question Answer Comment  Patient needs a walker to treat with the following condition Weakness generalized   Patient needs a walker to treat with the following condition Unsteady gait      02/01/22 1328            Follow-up Information     Samuel Contras, MD Follow up in 2 week(s).   Specialty: Internal Medicine Contact information: [address] [place] [address] [phone]                Discharge Exam: Filed Weights   01/31/22 1506 02/01/22 0500 02/02/22 0500  Weight: 45.4 kg 44.7 kg 46.6 kg   General-appears in no acute distress Heart-S1-S2, regular, no murmur auscultated Lungs-clear to auscultation bilaterally, no wheezing or crackles auscultated Abdomen-soft, nontender, no organomegaly Extremities-no edema in the lower extremities Neuro-alert, oriented x3, no focal deficit noted  Condition at discharge: good  The results of significant diagnostics from this hospitalization (including imaging, microbiology, ancillary and laboratory) are listed below for reference.   Imaging Studies: ECHOCARDIOGRAM COMPLETE  Result Date: 02/01/2022    ECHOCARDIOGRAM REPORT   Patient Name:   Samuel Kirk Date of Exam: 02/01/2022 Medical Rec #:  [id number]   Height:       72.0 in Accession #:    [id number]  Weight:       98.5 lb Date of Birth:  [birth year]   BSA:          1.577 m Patient Age:    86 years    BP:           111/67 mmHg Patient Gender: M           HR:           76 bpm. Exam Location:  Inpatient Procedure: 2D Echo, Cardiac Doppler and Color Doppler Indications:    R07.9* Chest pain, unspecified. Elevated troponin.  History:        Patient has no prior history of Echocardiogram examinations.                 Signs/Symptoms:Alzheimer's, Altered Mental Status, Shortness of                 Breath and Dyspnea.  Sonographer:    Sheralyn Boatman RDCS Referring Phys: [id number] ERIC CHEN  Sonographer Comments: Technically difficult study due to poor echo windows and suboptimal parasternal window. Patient has very thin habitus. Attempted to turn. IMPRESSIONS  1. Left ventricular ejection fraction, by estimation, is 65 to 70%. The left ventricle has normal function. The left ventricle has no regional wall motion abnormalities. There is moderate asymmetric left ventricular hypertrophy of the septal segment. Left ventricular diastolic  parameters are indeterminate.  2. Right ventricular systolic function is low normal. The right ventricular size is mildly enlarged. There is severely elevated pulmonary artery systolic pressure.  3. Right atrial size was mildly dilated.  4. The mitral valve is normal in structure. No evidence of mitral valve regurgitation. No evidence of mitral stenosis.  5. Tricuspid valve regurgitation is mild to moderate.  6. The aortic valve is calcified. Aortic valve regurgitation is not visualized. Aortic valve sclerosis/calcification is present, without any evidence of aortic stenosis.  7. The inferior vena cava is dilated in size with <50% respiratory variability, suggesting right atrial pressure of 15 mmHg. FINDINGS  Left Ventricle: Left ventricular ejection fraction, by estimation, is 65 to 70%. The left ventricle has normal function. The left ventricle has no regional wall motion abnormalities. The left ventricular internal cavity size was small. There is moderate  asymmetric left ventricular hypertrophy of the septal segment. Left ventricular diastolic parameters are indeterminate. Right Ventricle: The right ventricular size is mildly enlarged. No increase in right ventricular wall thickness. Right ventricular systolic function is low normal. There is severely elevated pulmonary artery systolic pressure. The tricuspid regurgitant velocity is 4.91 m/s, and with an assumed right atrial pressure of 15 mmHg, the estimated right ventricular  systolic pressure is 111.4 mmHg. Left Atrium: Left atrial size was normal in size. Right Atrium: Right atrial size was mildly dilated. Pericardium: There is no evidence of pericardial effusion. Presence of epicardial fat layer. Mitral Valve: The mitral valve is normal in structure. No evidence of mitral valve regurgitation. No evidence of mitral valve stenosis. Tricuspid Valve: The tricuspid valve is normal in structure. Tricuspid valve regurgitation is mild to moderate. No evidence of tricuspid stenosis. Aortic Valve: The aortic valve is calcified. Aortic valve regurgitation is not visualized. Aortic valve sclerosis/calcification is present, without any evidence of aortic stenosis. Pulmonic Valve: The pulmonic valve was not well visualized. Pulmonic valve regurgitation is trivial. No evidence of pulmonic stenosis. Aorta: The aortic root is normal in size and structure. Venous: The inferior vena cava is dilated in size with less than 50% respiratory variability, suggesting right atrial pressure of 15 mmHg. IAS/Shunts: No atrial level shunt detected by color flow Doppler.  LEFT VENTRICLE PLAX 2D LVIDd:         2.20 cm     Diastology LVIDs:         1.65 cm     LV e' medial:    5.98 cm/s LV PW:         1.20 cm     LV E/e' medial:  9.7 LV IVS:        1.75 cm     LV e' lateral:   14.65 cm/s LVOT diam:     2.30 cm     LV E/e' lateral: 3.9 LV SV:         59 LV SV Index:   37 LVOT Area:     4.15 cm  LV Volumes (MOD) LV vol d, MOD A2C: 48.5 ml LV vol d, MOD A4C: 54.0 ml LV vol s, MOD A2C: 15.9 ml LV vol s, MOD A4C: 10.4 ml LV SV MOD A2C:     32.6 ml LV SV MOD A4C:     54.0 ml LV SV MOD BP:      38.8 ml RIGHT VENTRICLE            IVC RV S prime:     9.20 cm/s  IVC diam: 2.30 cm TAPSE (M-mode):  1.8 cm LEFT ATRIUM           Index        RIGHT ATRIUM           Index LA diam:      2.55 cm 1.62 cm/m   RA Area:     18.30 cm LA Vol (A4C): 31.5 ml 19.98 ml/m  RA Volume:   60.90 ml  38.62 ml/m  AORTIC VALVE             PULMONIC  VALVE LVOT Vmax:   78.80 cm/s  PR End Diast Vel: 1.28 msec LVOT Vmean:  50.700 cm/s LVOT VTI:    0.142 m  AORTA Ao Root diam: 3.80 cm Ao Asc diam:  3.90 cm MITRAL VALVE               TRICUSPID VALVE MV Area (PHT): 3.24 cm    TR Peak grad:   96.4 mmHg MV Decel Time: 234 msec    TR Vmax:        491.00 cm/s MV E velocity: 57.75 cm/s MV A velocity: 73.35 cm/s  SHUNTS MV E/A ratio:  0.79        Systemic VTI:  0.14 m                            Systemic Diam: 2.30 cm Kardie Tobb DO Electronically signed by Thomasene Ripple DO Signature Date/Time: 02/01/2022/3:24:18 PM    Final    CT Angio Chest PE W and/or Wo Contrast  Result Date: 01/31/2022 CLINICAL DATA:  Dyspnea, high probability PE EXAM: CT ANGIOGRAPHY CHEST WITH CONTRAST TECHNIQUE: Multidetector CT imaging of the chest was performed using the standard protocol during bolus administration of intravenous contrast. Multiplanar CT image reconstructions and MIPs were obtained to evaluate the vascular anatomy. RADIATION DOSE REDUCTION: This exam was performed according to the departmental dose-optimization program which includes automated exposure control, adjustment of the mA and/or kV according to patient size and/or use of iterative reconstruction technique. CONTRAST:  24mL OMNIPAQUE IOHEXOL 350 MG/ML SOLN COMPARISON:  Radiographs earlier today FINDINGS: Cardiovascular: Satisfactory opacification of the pulmonary arteries to the segmental level. No evidence of pulmonary embolism. Enlarged right ventricle. No pericardial effusion. Aortic atherosclerotic calcification. Mediastinum/Nodes: Layering secretions along the right lateral trachea. No thoracic adenopathy by size. Lungs/Pleura: Right-greater-than-left airspace opacities likely infectious/inflammatory in nature. Mild scarring in the anterior upper lobes. Lower lobe bronchial wall thickening. Pulmonary cyst in the anterior lingula. No pleural effusion or pneumothorax. Upper Abdomen: Partially visualized large right  renal cyst. No acute abnormality. Musculoskeletal: Thoracic kyphosis. Extravasated contrast within the soft tissues of the visualized left upper extremity. Review of the MIP images confirms the above findings. IMPRESSION: 1. Negative for acute pulmonary embolism. 2. Infectious/inflammatory airspace opacities in the bilateral lower lobes may be due to pneumonia or aspiration pneumonitis. 3. Extravasated contrast within the soft tissues of the visualized left upper extremity. This was called to the ordering clinician or representative by the Radiology Department at the imaging location. Electronically Signed   By: Minerva Fester M.D.   On: 01/31/2022 19:24   CT HEAD WO CONTRAST ( )  Result Date: 01/31/2022 CLINICAL DATA:  Mental status change.  Dementia. EXAM: CT HEAD WITHOUT CONTRAST TECHNIQUE: Contiguous axial images were obtained from the base of the skull through the vertex without intravenous contrast. RADIATION DOSE REDUCTION: This exam was performed according to the departmental dose-optimization program which includes automated exposure control, adjustment  of the mA and/or kV according to patient size and/or use of iterative reconstruction technique. COMPARISON:  CT head 12/16/2019 FINDINGS: Brain: Moderate atrophy. Negative for hydrocephalus. Mild white matter hypodensity. Negative for acute infarct, hemorrhage, mass Vascular: Negative for hyperdense vessel Skull: Negative Sinuses/Orbits: Negative Other: None IMPRESSION: No acute abnormality. Generalized atrophy and mild chronic microvascular ischemic change in the white matter Electronically Signed   By: Marlan Palau M.D.   On: 01/31/2022 18:27   DG Chest 2 View  Result Date: 01/31/2022 CLINICAL DATA:  Shortness of breath. EXAM: CHEST - 2 VIEW COMPARISON:  February 07, 2019 FINDINGS: The study is limited secondary to patient rotation. The heart size and mediastinal contours are within normal limits. The lungs are inflated. There is no evidence of  acute infiltrate, pleural effusion or pneumothorax. The visualized skeletal structures are unremarkable. IMPRESSION: COPD without evidence of active cardiopulmonary disease. Electronically Signed   By: Aram Candela M.D.   On: 01/31/2022 15:54    Microbiology: Results for orders placed or performed during the hospital encounter of 01/31/22  SARS Coronavirus 2 by RT PCR (hospital order, performed in [facility] hospital lab) *cepheid single result test* Anterior Nasal Swab     Status: None   Collection Time: 01/31/22  3:25 PM   Specimen: Anterior Nasal Swab  Result Value Ref Range Status   SARS Coronavirus 2 by RT PCR NEGATIVE NEGATIVE Final    Comment: (NOTE) SARS-CoV-2 target nucleic acids are NOT DETECTED.  The SARS-CoV-2 RNA is generally detectable in upper and lower respiratory specimens during the acute phase of infection. The lowest concentration of SARS-CoV-2 viral copies this assay can detect is 250 copies / mL. A negative result does not preclude SARS-CoV-2 infection and should not be used as the sole basis for treatment or other patient management decisions.  A negative result may occur with improper specimen collection / handling, submission of specimen other than nasopharyngeal swab, presence of viral mutation(s) within the areas targeted by this assay, and inadequate number of viral copies (<250 copies / mL). A negative result must be combined with clinical observations, patient history, and epidemiological information.  Fact Sheet for Patients:   RoadLapTop.co.za  Fact Sheet for Healthcare Providers: http://kim-miller.com/  This test is not yet approved or  cleared by the [place] FDA and has been authorized for detection and/or diagnosis of SARS-CoV-2 by FDA under an Emergency Use Authorization (EUA).  This EUA Gautam remain in effect (meaning this test can be used) for the duration of the COVID-19 declaration under  Section 564(b)(1) of the Act, 21 U.S.C. section 360bbb-3(b)(1), unless the authorization is terminated or revoked sooner.  Performed at [facility], [address], [place], [place] [address]     Labs: CBC: Recent Labs  Lab 01/31/22 1521 02/01/22 0331  WBC 8.9 7.4  NEUTROABS 6.4 5.3  HGB 12.0* 10.0*  HCT 36.5* 30.2*  MCV 97.3 95.3  PLT 809* 737*   Basic Metabolic Panel: Recent Labs  Lab 01/31/22 1521 02/01/22 0331  NA 137 138  K 4.7 3.6  CL 109 105  CO2 24 26  GLUCOSE 103* 90  BUN 17 15  CREATININE 0.98 0.70  CALCIUM 9.0 8.5*  MG  --  1.9   Liver Function Tests: Recent Labs  Lab 01/31/22 1521 02/01/22 0331  AST 42* 22  ALT 16 13  ALKPHOS 68 59  BILITOT 0.5 0.6  PROT 7.5 6.2*  ALBUMIN 3.0* 2.6*   CBG: No results for  input(s): "GLUCAP" in the last 168 hours.  Discharge time spent: greater than 30 minutes.  Signed: Meredeth IdeGagan S Jameisha Stofko, MD [facility] 02/02/2022

## 2022-02-02 NOTE — Progress Notes (Signed)
Patient and daughter, Lorna Dibble, have been read and explained discharge instructions. Patient and daughter have no further questions at this time. IV of the left and right arm have been removed. Sites are clean, dry and intact.  Sinclair Ship, RN

## 2022-02-02 NOTE — Evaluation (Signed)
Physical Therapy One Time Evaluation Patient Details Name: Samuel Kirk MRN: [id number] DOB: 10-04-1934 Today's Date: 02/02/2022  History of Present Illness  87 year old male with a history of dementia, gait abnormality, seizures came to ED with complaints of worsening shortness of breath.  Pt found to have elevated troponin  Clinical Impression  Patient evaluated by Physical Therapy with no further acute PT needs identified. All education has been completed and the patient has no further questions. Pt very HOH however follows simple commands well. Per chart, pt living with daughter currently.  Pt would benefit from supervision for safety and appears more custodial level care at this time.  Pt [place] need cues to use rollator safely (already delivered and taken home apparently). No further recommendations at this time. PT is signing off. Thank you for this referral.        Recommendations for follow up therapy are one component of a multi-disciplinary discharge planning process, led by the attending physician.  Recommendations may be updated based on patient status, additional functional criteria and insurance authorization.  Follow Up Recommendations No PT follow up      Assistance Recommended at Discharge Frequent or constant Supervision/Assistance  Patient can return home with the following  A little help with walking and/or transfers;Help with stairs or ramp for entrance;Assist for transportation;Assistance with cooking/housework;Direct supervision/assist for financial management;Direct supervision/assist for medications management    Equipment Recommendations Other (comment) (equipment discussed with TOC, rollator already delivered and taken home, pt can use but Nazim need supervision and cues for use as he is not likely to remember to use brakes (RN aware and states daughter already knows))  Recommendations for Other Services       Functional Status Assessment Patient has not had a recent  decline in their functional status     Precautions / Restrictions Precautions Precautions: Fall      Mobility  Bed Mobility               General bed mobility comments: pt in recliner on arrival    Transfers Overall transfer level: Needs assistance Equipment used: Rolling walker (2 wheels) Transfers: Sit to/from Stand Sit to Stand: Min guard           General transfer comment: cues for hand placement to self assist    Ambulation/Gait Ambulation/Gait assistance: Min guard Gait Distance (Feet): 100 Feet Assistive device: Rolling walker (2 wheels) Gait Pattern/deviations: Step-through pattern, Decreased stride length, Trunk flexed       General Gait Details: cues for RW positioning, min/guard for safety  Stairs            Wheelchair Mobility    Modified Rankin (Stroke Patients Only)       Balance Overall balance assessment: Mild deficits observed, not formally tested                                           Pertinent Vitals/Pain Pain Assessment Pain Assessment: No/denies pain    Home Living Family/patient expects to be discharged to:: Private residence Living Arrangements: Children               Home Equipment: Gilmer Mor - single point      Prior Function               Mobility Comments: pt poor historian, per chart review, pt has been living with daughter and son in  law who provide supervision (wandering)       Hand Dominance        Extremity/Trunk Assessment        Lower Extremity Assessment Lower Extremity Assessment: Generalized weakness    Cervical / Trunk Assessment Cervical / Trunk Assessment: Kyphotic;Other exceptions Cervical / Trunk Exceptions: cachectic appearance  Communication   Communication: HOH  Cognition Arousal/Alertness: Awake/alert Behavior During Therapy: Flat affect Overall Cognitive Status: No family/caregiver present to determine baseline cognitive functioning                                  General Comments: pt very HOH, hx dementia; follows simple commands        General Comments      Exercises     Assessment/Plan    PT Assessment Patient does not need any further PT services  PT Problem List         PT Treatment Interventions      PT Goals (Current goals can be found in the Care Plan section)  Acute Rehab PT Goals PT Goal Formulation: All assessment and education complete, DC therapy    Frequency       Co-evaluation               AM-PAC PT "6 Clicks" Mobility  Outcome Measure Help needed turning from your back to your side while in a flat bed without using bedrails?: A Little Help needed moving from lying on your back to sitting on the side of a flat bed without using bedrails?: A Little Help needed moving to and from a bed to a chair (including a wheelchair)?: A Little Help needed standing up from a chair using your arms (e.g., wheelchair or bedside chair)?: A Little Help needed to walk in hospital room?: A Little Help needed climbing 3-5 steps with a railing? : A Little 6 Click Score: 18    End of Session Equipment Utilized During Treatment: Gait belt Activity Tolerance: Patient tolerated treatment well Patient left: in chair;with call bell/phone within reach;with chair alarm set Nurse Communication: Mobility status PT Visit Diagnosis: Difficulty in walking, not elsewhere classified (R26.2)    Time: 7353-2992 PT Time Calculation (min) (ACUTE ONLY): 12 min   Charges:   PT Evaluation $PT Eval Low Complexity: 1 Low         Kati PT, DPT Physical Therapist Acute Rehabilitation Services Preferred contact method: Secure Chat Weekend Pager Only: [phone] Office: [phone]   Kati L Payson 02/02/2022, 3:00 PM

## 2022-02-02 NOTE — Plan of Care (Signed)
Mr. Strothman has normal LV, No WMA.  Has pulmonary HTN. Can continue care for PNA. No changes from a cardiac perspective. Cardiology [place] sign off

## 2022-02-02 NOTE — TOC Progression Note (Signed)
Transition of Care [facility]) - Progression Note    Patient Details  Name: Samuel Kirk MRN: [id number] Date of Birth: [birth year]  [facility] CM/SW Contact  Lennart Pall, LCSW Phone Number: 02/02/2022, 2:12 PM  Clinical Narrative:    Met with pt and son and spoke with PT following eval today.  Confirming with son that pt does live with daughter, Samuel Kirk, who provides 24/7 support and plans for him to return there at dc.  Confirming with [place] that rollator order was placed yesterday and delivered to room and family has taken home.  Pt functioning at custodial care level.  No further TOC needs.   Expected Discharge Plan: [place] Barriers to Discharge: Continued Medical Work up  Expected Discharge Plan and Services Expected Discharge Plan: [place]   Discharge Planning Services: CM Consult   Living arrangements for the past 2 months: Single Family Home                 DME Arranged: Walker rolling with seat DME Agency: [employer] Date DME Agency Contacted: 02/01/22   Representative spoke with at DME Agency: Andee Poles             Social Determinants of Health ([place]) Interventions    Readmission Risk Interventions     No data to display

## 2022-02-13 ENCOUNTER — Other Ambulatory Visit: Payer: Self-pay

## 2022-02-13 ENCOUNTER — Inpatient Hospital Stay (HOSPITAL_COMMUNITY)
Admission: EM | Admit: 2022-02-13 | Discharge: 2022-03-02 | DRG: 871 | Disposition: E | Payer: Medicare Other | Attending: Pulmonary Disease | Admitting: Pulmonary Disease

## 2022-02-13 ENCOUNTER — Emergency Department (HOSPITAL_COMMUNITY): Payer: Medicare Other

## 2022-02-13 ENCOUNTER — Encounter (HOSPITAL_COMMUNITY): Payer: Self-pay

## 2022-02-13 DIAGNOSIS — Z66 Do not resuscitate: Secondary | ICD-10-CM | POA: Diagnosis not present

## 2022-02-13 DIAGNOSIS — A419 Sepsis, unspecified organism: Secondary | ICD-10-CM | POA: Diagnosis not present

## 2022-02-13 DIAGNOSIS — R6521 Severe sepsis with septic shock: Secondary | ICD-10-CM | POA: Diagnosis present

## 2022-02-13 DIAGNOSIS — J189 Pneumonia, unspecified organism: Secondary | ICD-10-CM

## 2022-02-13 DIAGNOSIS — I2721 Secondary pulmonary arterial hypertension: Secondary | ICD-10-CM | POA: Diagnosis present

## 2022-02-13 DIAGNOSIS — D75839 Thrombocytosis, unspecified: Secondary | ICD-10-CM

## 2022-02-13 DIAGNOSIS — Z818 Family history of other mental and behavioral disorders: Secondary | ICD-10-CM

## 2022-02-13 DIAGNOSIS — G40909 Epilepsy, unspecified, not intractable, without status epilepticus: Secondary | ICD-10-CM | POA: Diagnosis present

## 2022-02-13 DIAGNOSIS — F0390 Unspecified dementia without behavioral disturbance: Secondary | ICD-10-CM | POA: Diagnosis present

## 2022-02-13 DIAGNOSIS — R64 Cachexia: Secondary | ICD-10-CM | POA: Diagnosis present

## 2022-02-13 DIAGNOSIS — R569 Unspecified convulsions: Secondary | ICD-10-CM

## 2022-02-13 DIAGNOSIS — R946 Abnormal results of thyroid function studies: Secondary | ICD-10-CM | POA: Diagnosis present

## 2022-02-13 DIAGNOSIS — R911 Solitary pulmonary nodule: Secondary | ICD-10-CM | POA: Diagnosis present

## 2022-02-13 DIAGNOSIS — R778 Other specified abnormalities of plasma proteins: Secondary | ICD-10-CM | POA: Diagnosis present

## 2022-02-13 DIAGNOSIS — J9601 Acute respiratory failure with hypoxia: Secondary | ICD-10-CM

## 2022-02-13 DIAGNOSIS — Z20822 Contact with and (suspected) exposure to covid-19: Secondary | ICD-10-CM | POA: Diagnosis present

## 2022-02-13 DIAGNOSIS — Z681 Body mass index (BMI) 19 or less, adult: Secondary | ICD-10-CM

## 2022-02-13 DIAGNOSIS — E43 Unspecified severe protein-calorie malnutrition: Secondary | ICD-10-CM | POA: Diagnosis present

## 2022-02-13 DIAGNOSIS — R7989 Other specified abnormal findings of blood chemistry: Secondary | ICD-10-CM

## 2022-02-13 DIAGNOSIS — R6520 Severe sepsis without septic shock: Secondary | ICD-10-CM

## 2022-02-13 DIAGNOSIS — Z79899 Other long term (current) drug therapy: Secondary | ICD-10-CM

## 2022-02-13 DIAGNOSIS — R627 Adult failure to thrive: Secondary | ICD-10-CM | POA: Diagnosis present

## 2022-02-13 DIAGNOSIS — N179 Acute kidney failure, unspecified: Secondary | ICD-10-CM

## 2022-02-13 DIAGNOSIS — Z720 Tobacco use: Secondary | ICD-10-CM

## 2022-02-13 DIAGNOSIS — R739 Hyperglycemia, unspecified: Secondary | ICD-10-CM

## 2022-02-13 LAB — PROTIME-INR
INR: 1.3 — ABNORMAL HIGH (ref 0.8–1.2)
Prothrombin Time: 16.3 seconds — ABNORMAL HIGH (ref 11.4–15.2)

## 2022-02-13 LAB — APTT: aPTT: 36 seconds (ref 24–36)

## 2022-02-13 NOTE — ED Provider Notes (Signed)
[facility] Note   CSN: [id number] Arrival date & time: 2022/02/28  2251     History {Add pertinent medical, surgical, social history, OB history to HPI:1} Chief Complaint  Patient presents with   Shortness of Breath    Samuel Kirk is a 87 y.o. male.  HPI     This is an 87 year old male who presents with shortness of breath.  Patient noted to be 75% on room air in triage.  Recent admission for similar presentation.  Found to have aspiration pneumonia on CT scan.  Was discharged on Augmentin to complete a 7-day course.  Patient does not provide much history.  He can, he is short of breath.  Does not have any other symptoms.  Daughter states that he began to complain of shortness of breath and "his chest" which prompted her to bring him to the hospital.  No recent smoking although he has a history of smoking.  He does chew tobacco.  No noted fevers although he was chilled earlier today.  Chart reviewed.  Recent hospitalization at the beginning of August for shortness of breath and presumed aspiration pneumonia.  Also had an elevated troponin at that time.  No cardiology intervention recommended.  Home Medications Prior to Admission medications   Medication Sig Start Date End Date Taking? Authorizing Provider  amoxicillin-clavulanate (AUGMENTIN) 875-125 MG tablet Take 1 tablet by mouth 2 (two) times daily. 02/02/22   Meredeth Ide, MD  divalproex (DEPAKOTE) 500 MG DR tablet One tablet in the morning and 2 in the evening Patient taking differently: Take 1,500 mg by mouth at bedtime. 04/27/19   Glean Salvo, NP  donepezil (ARICEPT) 5 MG tablet Take 5 mg by mouth every morning.     [provider]  feeding supplement (ENSURE ENLIVE / ENSURE PLUS) LIQD Take 237 mLs by mouth 3 (three) times daily between meals. 02/02/22   Meredeth Ide, MD  furosemide (LASIX) 20 MG tablet Take 20 mg by mouth daily as needed for edema. 01/10/20   [provider]  Iron-FA-B Cmp-C-Biot-Probiotic (FUSION PLUS) CAPS Take 1 capsule by mouth daily.    [provider]  levocetirizine (XYZAL) 5 MG tablet Take 5 mg by mouth every evening.    [provider]  linaclotide (LINZESS) 72 MCG capsule Take 72 mcg by mouth daily before breakfast.    [provider]  melatonin 5 MG TABS Take 5 mg by mouth at bedtime.    [provider]  memantine (NAMENDA) 10 MG tablet TAKE 1 TABLET BY MOUTH TWICE A DAY Patient not taking: Reported on 02/01/2022 03/14/20   Glean Salvo, NP  mirtazapine (REMERON) 7.5 MG tablet Take 7.5 mg by mouth at bedtime.    [provider]  phenytoin (DILANTIN) 100 MG ER capsule TAKE 1 CAPSULE BY MOUTH TWICE A DAY Patient taking differently: Take 100-200 mg by mouth See admin instructions. Take 1 capsule in the morning then take 2 capsules in the evening 05/02/20   Glean Salvo, NP      Allergies    Patient has no known allergies.    Review of Systems   Review of Systems  Respiratory:  Positive for shortness of breath.   Cardiovascular:  Negative for chest pain.  All other systems reviewed and are negative.   Physical Exam Updated Vital Signs BP 102/79   Pulse 94   Resp (!) 41   Ht 1.829 m (6')   Wt 45  kg   SpO2 (!) 75%   BMI 13.45 kg/m  Physical Exam Vitals and nursing note reviewed.  Constitutional:      Comments: Elderly, generally ill-appearing, cachectic  HENT:     Head: Normocephalic and atraumatic.  Eyes:     Pupils: Pupils are equal, round, and reactive to light.  Cardiovascular:     Rate and Rhythm: Normal rate and regular rhythm.     Heart sounds: Normal heart sounds. No murmur heard. Pulmonary:     Effort: Pulmonary effort is normal. Tachypnea present. No respiratory distress.     Breath sounds: Decreased breath sounds present. No wheezing.     Comments: Diminished breath sounds in all lung fields, tachypnea Abdominal:     General: Bowel sounds are  normal.     Palpations: Abdomen is soft.     Tenderness: There is no abdominal tenderness. There is no rebound.  Musculoskeletal:     Cervical back: Neck supple.     Right lower leg: No edema.     Left lower leg: No edema.  Lymphadenopathy:     Cervical: No cervical adenopathy.  Skin:    General: Skin is warm and dry.  Neurological:     Mental Status: He is alert and oriented to person, place, and time.  Psychiatric:        Mood and Affect: Mood normal.     ED Results / Procedures / Treatments   Labs (all labs ordered are listed, but only abnormal results are displayed) Labs Reviewed  CULTURE, BLOOD (ROUTINE X 2)  CULTURE, BLOOD (ROUTINE X 2)  URINE CULTURE  LACTIC ACID, PLASMA  LACTIC ACID, PLASMA  COMPREHENSIVE METABOLIC PANEL  CBC WITH DIFFERENTIAL/PLATELET  PROTIME-INR  APTT  URINALYSIS, ROUTINE W REFLEX MICROSCOPIC  TROPONIN I (HIGH SENSITIVITY)    EKG None  Radiology No results found.  Procedures Procedures  {Document cardiac monitor, telemetry assessment procedure when appropriate:1}  Medications Ordered in ED Medications - No data to display  ED Course/ Medical Decision Making/ A&P                           Medical Decision Making Amount and/or Complexity of Data Reviewed Labs: ordered. Radiology: ordered. ECG/medicine tests: ordered.   ***  {Document critical care time when appropriate:1} {Document review of labs and clinical decision tools ie heart score, Chads2Vasc2 etc:1}  {Document your independent review of radiology images, and any outside records:1} {Document your discussion with family members, caretakers, and with consultants:1} {Document social determinants of health affecting pt's care:1} {Document your decision making why or why not admission, treatments were needed:1} Final Clinical Impression(s) / ED Diagnoses Final diagnoses:  None    Rx / DC Orders ED Discharge Orders     None

## 2022-02-13 NOTE — ED Triage Notes (Addendum)
Patient has been short of breath all day per family. Increased weakness. Was recently discharged out of hospital Friday for shortness of breath. 75% room air in triage.

## 2022-02-14 ENCOUNTER — Encounter (HOSPITAL_COMMUNITY): Payer: Self-pay

## 2022-02-14 ENCOUNTER — Emergency Department (HOSPITAL_COMMUNITY): Payer: Medicare Other

## 2022-02-14 DIAGNOSIS — R739 Hyperglycemia, unspecified: Secondary | ICD-10-CM | POA: Diagnosis present

## 2022-02-14 DIAGNOSIS — J9601 Acute respiratory failure with hypoxia: Secondary | ICD-10-CM

## 2022-02-14 DIAGNOSIS — Z818 Family history of other mental and behavioral disorders: Secondary | ICD-10-CM | POA: Diagnosis not present

## 2022-02-14 DIAGNOSIS — A419 Sepsis, unspecified organism: Secondary | ICD-10-CM

## 2022-02-14 DIAGNOSIS — Z515 Encounter for palliative care: Secondary | ICD-10-CM | POA: Diagnosis not present

## 2022-02-14 DIAGNOSIS — J189 Pneumonia, unspecified organism: Secondary | ICD-10-CM | POA: Diagnosis present

## 2022-02-14 DIAGNOSIS — Z681 Body mass index (BMI) 19 or less, adult: Secondary | ICD-10-CM | POA: Diagnosis not present

## 2022-02-14 DIAGNOSIS — Z7189 Other specified counseling: Secondary | ICD-10-CM

## 2022-02-14 DIAGNOSIS — E43 Unspecified severe protein-calorie malnutrition: Secondary | ICD-10-CM | POA: Diagnosis present

## 2022-02-14 DIAGNOSIS — R7989 Other specified abnormal findings of blood chemistry: Secondary | ICD-10-CM

## 2022-02-14 DIAGNOSIS — R911 Solitary pulmonary nodule: Secondary | ICD-10-CM | POA: Diagnosis present

## 2022-02-14 DIAGNOSIS — I2721 Secondary pulmonary arterial hypertension: Secondary | ICD-10-CM | POA: Diagnosis present

## 2022-02-14 DIAGNOSIS — D75839 Thrombocytosis, unspecified: Secondary | ICD-10-CM

## 2022-02-14 DIAGNOSIS — R627 Adult failure to thrive: Secondary | ICD-10-CM | POA: Diagnosis present

## 2022-02-14 DIAGNOSIS — R6521 Severe sepsis with septic shock: Secondary | ICD-10-CM | POA: Diagnosis present

## 2022-02-14 DIAGNOSIS — I2729 Other secondary pulmonary hypertension: Secondary | ICD-10-CM

## 2022-02-14 DIAGNOSIS — R778 Other specified abnormalities of plasma proteins: Secondary | ICD-10-CM | POA: Diagnosis present

## 2022-02-14 DIAGNOSIS — R6520 Severe sepsis without septic shock: Secondary | ICD-10-CM

## 2022-02-14 DIAGNOSIS — Z79899 Other long term (current) drug therapy: Secondary | ICD-10-CM | POA: Diagnosis not present

## 2022-02-14 DIAGNOSIS — F0390 Unspecified dementia without behavioral disturbance: Secondary | ICD-10-CM | POA: Diagnosis present

## 2022-02-14 DIAGNOSIS — N179 Acute kidney failure, unspecified: Secondary | ICD-10-CM | POA: Diagnosis present

## 2022-02-14 DIAGNOSIS — G40909 Epilepsy, unspecified, not intractable, without status epilepticus: Secondary | ICD-10-CM | POA: Diagnosis present

## 2022-02-14 DIAGNOSIS — Z20822 Contact with and (suspected) exposure to covid-19: Secondary | ICD-10-CM | POA: Diagnosis present

## 2022-02-14 DIAGNOSIS — R946 Abnormal results of thyroid function studies: Secondary | ICD-10-CM | POA: Diagnosis present

## 2022-02-14 DIAGNOSIS — Z720 Tobacco use: Secondary | ICD-10-CM | POA: Diagnosis not present

## 2022-02-14 DIAGNOSIS — R64 Cachexia: Secondary | ICD-10-CM | POA: Diagnosis present

## 2022-02-14 DIAGNOSIS — Z66 Do not resuscitate: Secondary | ICD-10-CM | POA: Diagnosis not present

## 2022-02-14 LAB — CBC
HCT: 35.9 % — ABNORMAL LOW (ref 39.0–52.0)
Hemoglobin: 11.6 g/dL — ABNORMAL LOW (ref 13.0–17.0)
MCH: 32 pg (ref 26.0–34.0)
MCHC: 32.3 g/dL (ref 30.0–36.0)
MCV: 99.2 fL (ref 80.0–100.0)
Platelets: 1244 10*3/uL (ref 150–400)
RBC: 3.62 MIL/uL — ABNORMAL LOW (ref 4.22–5.81)
RDW: 25.9 % — ABNORMAL HIGH (ref 11.5–15.5)
WBC: 14.4 10*3/uL — ABNORMAL HIGH (ref 4.0–10.5)
nRBC: 0.8 % — ABNORMAL HIGH (ref 0.0–0.2)

## 2022-02-14 LAB — BLOOD GAS, ARTERIAL
Acid-Base Excess: 0.4 mmol/L (ref 0.0–2.0)
Acid-base deficit: 2.8 mmol/L — ABNORMAL HIGH (ref 0.0–2.0)
Bicarbonate: 21.9 mmol/L (ref 20.0–28.0)
Bicarbonate: 23.1 mmol/L (ref 20.0–28.0)
Drawn by: 56037
FIO2: 50 %
O2 Saturation: 89.7 %
O2 Saturation: 90.9 %
Patient temperature: 37
Patient temperature: 37
RATE: 8 resp/min
pCO2 arterial: 31 mmHg — ABNORMAL LOW (ref 32–48)
pCO2 arterial: 37 mmHg (ref 32–48)
pH, Arterial: 7.38 (ref 7.35–7.45)
pH, Arterial: 7.48 — ABNORMAL HIGH (ref 7.35–7.45)
pO2, Arterial: 59 mmHg — ABNORMAL LOW (ref 83–108)
pO2, Arterial: 63 mmHg — ABNORMAL LOW (ref 83–108)

## 2022-02-14 LAB — SARS CORONAVIRUS 2 BY RT PCR: SARS Coronavirus 2 by RT PCR: NEGATIVE

## 2022-02-14 LAB — COMPREHENSIVE METABOLIC PANEL
ALT: 21 U/L (ref 0–44)
ALT: 29 U/L (ref 0–44)
AST: 48 U/L — ABNORMAL HIGH (ref 15–41)
AST: 78 U/L — ABNORMAL HIGH (ref 15–41)
Albumin: 3.6 g/dL (ref 3.5–5.0)
Albumin: 2.9 g/dL — ABNORMAL LOW (ref 3.5–5.0)
Alkaline Phosphatase: 86 U/L (ref 38–126)
Alkaline Phosphatase: 86 U/L (ref 38–126)
Anion gap: 14 (ref 5–15)
Anion gap: 12 (ref 5–15)
BUN: 18 mg/dL (ref 8–23)
BUN: 17 mg/dL (ref 8–23)
CO2: 20 mmol/L — ABNORMAL LOW (ref 22–32)
CO2: 18 mmol/L — ABNORMAL LOW (ref 22–32)
Calcium: 9.4 mg/dL (ref 8.9–10.3)
Calcium: 8.4 mg/dL — ABNORMAL LOW (ref 8.9–10.3)
Chloride: 102 mmol/L (ref 98–111)
Chloride: 107 mmol/L (ref 98–111)
Creatinine, Ser: 1.44 mg/dL — ABNORMAL HIGH (ref 0.61–1.24)
Creatinine, Ser: 1.06 mg/dL (ref 0.61–1.24)
GFR, Estimated: 48 mL/min — ABNORMAL LOW (ref >60)
GFR, Estimated: >60 mL/min (ref >60)
Glucose, Bld: 241 mg/dL — ABNORMAL HIGH (ref 70–99)
Glucose, Bld: 72 mg/dL (ref 70–99)
Potassium: 4.3 mmol/L (ref 3.5–5.1)
Potassium: 5.1 mmol/L (ref 3.5–5.1)
Sodium: 136 mmol/L (ref 135–145)
Sodium: 137 mmol/L (ref 135–145)
Total Bilirubin: 0.6 mg/dL (ref 0.3–1.2)
Total Bilirubin: 0.6 mg/dL (ref 0.3–1.2)
Total Protein: 8.8 g/dL — ABNORMAL HIGH (ref 6.5–8.1)
Total Protein: 7 g/dL (ref 6.5–8.1)

## 2022-02-14 LAB — GLUCOSE, CAPILLARY: Glucose-Capillary: 124 mg/dL — ABNORMAL HIGH (ref 70–99)

## 2022-02-14 LAB — CBC WITH DIFFERENTIAL/PLATELET
Abs Immature Granulocytes: 0.25 10*3/uL — ABNORMAL HIGH (ref 0.00–0.07)
Basophils Absolute: 0.1 10*3/uL (ref 0.0–0.1)
Basophils Relative: 1 %
Eosinophils Absolute: 0 10*3/uL (ref 0.0–0.5)
Eosinophils Relative: 0 %
HCT: 43.4 % (ref 39.0–52.0)
Hemoglobin: 13.7 g/dL (ref 13.0–17.0)
Immature Granulocytes: 2 %
Lymphocytes Relative: 9 %
Lymphs Abs: 1.2 10*3/uL (ref 0.7–4.0)
MCH: 31.8 pg (ref 26.0–34.0)
MCHC: 31.6 g/dL (ref 30.0–36.0)
MCV: 100.7 fL — ABNORMAL HIGH (ref 80.0–100.0)
Monocytes Absolute: 0.5 10*3/uL (ref 0.1–1.0)
Monocytes Relative: 4 %
Neutro Abs: 10.9 10*3/uL — ABNORMAL HIGH (ref 1.7–7.7)
Neutrophils Relative %: 84 %
Platelets: 1192 10*3/uL (ref 150–400)
RBC: 4.31 MIL/uL (ref 4.22–5.81)
RDW: 26 % — ABNORMAL HIGH (ref 11.5–15.5)
WBC: 12.9 10*3/uL — ABNORMAL HIGH (ref 4.0–10.5)
nRBC: 1.2 % — ABNORMAL HIGH (ref 0.0–0.2)

## 2022-02-14 LAB — URINALYSIS, ROUTINE W REFLEX MICROSCOPIC
Bacteria, UA: NONE SEEN
Bilirubin Urine: NEGATIVE
Glucose, UA: NEGATIVE mg/dL
Hgb urine dipstick: NEGATIVE
Ketones, ur: NEGATIVE mg/dL
Leukocytes,Ua: NEGATIVE
Nitrite: NEGATIVE
Protein, ur: 30 mg/dL — AB
Specific Gravity, Urine: 1.014 (ref 1.005–1.030)
pH: 5 (ref 5.0–8.0)

## 2022-02-14 LAB — RESPIRATORY PANEL BY PCR

## 2022-02-14 LAB — TROPONIN I (HIGH SENSITIVITY)
Troponin I (High Sensitivity): 592 ng/L (ref <18)
Troponin I (High Sensitivity): 617 ng/L (ref <18)

## 2022-02-14 LAB — LACTIC ACID, PLASMA
Lactic Acid, Venous: 6.2 mmol/L (ref 0.5–1.9)
Lactic Acid, Venous: 4.4 mmol/L (ref 0.5–1.9)
Lactic Acid, Venous: 3.5 mmol/L (ref 0.5–1.9)

## 2022-02-14 LAB — PHENYTOIN LEVEL, TOTAL: Phenytoin Lvl: <2.5 ug/mL — ABNORMAL LOW (ref 10.0–20.0)

## 2022-02-14 LAB — VALPROIC ACID LEVEL: Valproic Acid Lvl: 29 ug/mL — ABNORMAL LOW (ref 50.0–100.0)

## 2022-02-14 LAB — TSH: TSH: 4.848 u[IU]/mL — ABNORMAL HIGH (ref 0.350–4.500)

## 2022-02-14 LAB — PROCALCITONIN: Procalcitonin: <0.1 ng/mL

## 2022-02-14 LAB — PATHOLOGIST SMEAR REVIEW

## 2022-02-14 LAB — MRSA NEXT GEN BY PCR, NASAL: MRSA by PCR Next Gen: NOT DETECTED

## 2022-02-14 MED ORDER — LACTATED RINGERS IV SOLN: INTRAVENOUS | Status: DC

## 2022-02-14 MED ORDER — VALPROATE SODIUM 100 MG/ML IV SOLN
500.0000 mg | Freq: Three times a day (TID) | INTRAVENOUS | Status: DC
  Administered 2022-02-14 (×2): 500 mg via INTRAVENOUS
  Filled 2022-02-14 (×4): qty 5

## 2022-02-14 MED ORDER — VANCOMYCIN HCL IN DEXTROSE 1-5 GM/200ML-% IV SOLN
1000.0000 mg | Freq: Once | INTRAVENOUS | Status: AC
  Administered 2022-02-14: 1000 mg via INTRAVENOUS
  Filled 2022-02-14: qty 200

## 2022-02-14 MED ORDER — SODIUM CHLORIDE (PF) 0.9 % IJ SOLN
INTRAMUSCULAR | Status: AC
  Filled 2022-02-14: qty 50

## 2022-02-14 MED ORDER — SODIUM CHLORIDE 0.9 % IV SOLN
2.0000 g | Freq: Two times a day (BID) | INTRAVENOUS | Status: DC
  Administered 2022-02-14: 2 g via INTRAVENOUS
  Filled 2022-02-14: qty 12.5

## 2022-02-14 MED ORDER — LORAZEPAM 2 MG/ML IJ SOLN
0.5000 mg | INTRAMUSCULAR | Status: DC | PRN
Start: 2022-02-14 — End: 2022-02-15

## 2022-02-14 MED ORDER — LORAZEPAM 2 MG/ML IJ SOLN
0.5000 mg | Freq: Once | INTRAMUSCULAR | Status: AC
  Administered 2022-02-14: 0.5 mg via INTRAVENOUS
  Filled 2022-02-14: qty 1

## 2022-02-14 MED ORDER — SODIUM CHLORIDE 0.9 % IV SOLN
2.0000 g | Freq: Once | INTRAVENOUS | Status: AC
  Administered 2022-02-14: 2 g via INTRAVENOUS
  Filled 2022-02-14: qty 12.5

## 2022-02-14 MED ORDER — DEXMEDETOMIDINE HCL IN NACL 200 MCG/50ML IV SOLN
0.4000 ug/kg/h | INTRAVENOUS | Status: DC
  Administered 2022-02-14: 0.8 ug/kg/h via INTRAVENOUS
  Administered 2022-02-14: 0.4 ug/kg/h via INTRAVENOUS
  Filled 2022-02-14 (×2): qty 50

## 2022-02-14 MED ORDER — IOHEXOL 300 MG/ML  SOLN
80.0000 mL | Freq: Once | INTRAMUSCULAR | Status: AC | PRN
  Administered 2022-02-14: 80 mL via INTRAVENOUS

## 2022-02-14 MED ORDER — ORAL CARE MOUTH RINSE
15.0000 mL | OROMUCOSAL | Status: DC | PRN
Start: 2022-02-14 — End: 2022-02-15

## 2022-02-14 MED ORDER — METRONIDAZOLE 500 MG/100ML IV SOLN
500.0000 mg | Freq: Once | INTRAVENOUS | Status: AC
  Administered 2022-02-14: 500 mg via INTRAVENOUS
  Filled 2022-02-14: qty 100

## 2022-02-14 MED ORDER — CHLORHEXIDINE GLUCONATE CLOTH 2 % EX PADS
6.0000 | MEDICATED_PAD | Freq: Every day | CUTANEOUS | Status: DC
  Administered 2022-02-14: 6 via TOPICAL

## 2022-02-14 MED ORDER — LACTATED RINGERS IV BOLUS
500.0000 mL | Freq: Once | INTRAVENOUS | Status: AC
  Administered 2022-02-14: 500 mL via INTRAVENOUS

## 2022-02-14 MED ORDER — NOREPINEPHRINE 4 MG/250ML-% IV SOLN
2.0000 ug/min | INTRAVENOUS | Status: DC
  Administered 2022-02-14: 2 ug/min via INTRAVENOUS
  Filled 2022-02-14: qty 250

## 2022-02-14 MED ORDER — LORAZEPAM 2 MG/ML IJ SOLN
0.5000 mg | Freq: Four times a day (QID) | INTRAMUSCULAR | Status: DC | PRN
  Administered 2022-02-14: 0.5 mg via INTRAVENOUS
  Filled 2022-02-14: qty 1

## 2022-02-14 MED ORDER — LACTATED RINGERS IV BOLUS (SEPSIS)
500.0000 mL | Freq: Once | INTRAVENOUS | Status: AC
  Administered 2022-02-14: 500 mL via INTRAVENOUS

## 2022-02-14 MED ORDER — SODIUM CHLORIDE 0.9 % IV SOLN: 2.0000 g | Freq: Once | INTRAVENOUS | Status: DC

## 2022-02-14 MED ORDER — LACTATED RINGERS IV BOLUS (SEPSIS)
1000.0000 mL | Freq: Once | INTRAVENOUS | Status: AC
  Administered 2022-02-14: 1000 mL via INTRAVENOUS

## 2022-02-14 MED ORDER — ORAL CARE MOUTH RINSE
15.0000 mL | OROMUCOSAL | Status: DC
  Administered 2022-02-14 (×2): 15 mL via OROMUCOSAL

## 2022-02-14 MED ORDER — SODIUM CHLORIDE 0.9 % IV SOLN: 1.0000 g | Freq: Once | INTRAVENOUS | Status: DC

## 2022-02-14 MED ORDER — MORPHINE SULFATE (PF) 2 MG/ML IV SOLN
1.0000 mg | INTRAVENOUS | Status: DC | PRN
  Administered 2022-02-14 (×2): 1 mg via INTRAVENOUS
  Filled 2022-02-14 (×2): qty 1

## 2022-02-14 MED ORDER — SODIUM CHLORIDE 0.9 % IV BOLUS
1000.0000 mL | Freq: Once | INTRAVENOUS | Status: AC
  Administered 2022-02-14: 1000 mL via INTRAVENOUS

## 2022-02-14 MED ORDER — SODIUM CHLORIDE 0.9 % IV SOLN
250.0000 mL | INTRAVENOUS | Status: DC
  Administered 2022-02-14: 250 mL via INTRAVENOUS

## 2022-02-14 MED ORDER — PHENYTOIN SODIUM 50 MG/ML IJ SOLN
100.0000 mg | Freq: Three times a day (TID) | INTRAMUSCULAR | Status: DC
  Administered 2022-02-14 (×2): 100 mg via INTRAVENOUS
  Filled 2022-02-14 (×2): qty 2

## 2022-02-15 LAB — URINE CULTURE: Culture: NO GROWTH

## 2022-02-19 LAB — CULTURE, BLOOD (ROUTINE X 2)
Culture: NO GROWTH
Culture: NO GROWTH
Special Requests: ADEQUATE

## 2022-03-02 NOTE — ED Notes (Signed)
Bear hugger applied 

## 2022-03-02 NOTE — Consult Note (Signed)
NAMECarl Kirk, MRN:  [id number], DOB:  01-02-1935, LOS: 0 ADMISSION DATE:  01/30/2022, CONSULTATION DATE:  02/23/2022 REFERRING MD:  Dr. Ronaldo Miyamoto - [facility], CHIEF COMPLAINT:  Respiratory distress   History of Present Illness:  Samuel Kirk is a 87 y.o.a male with dementia and seizures who presented to the ED for complaints of shortness of breath and increased weakness. Of note patient was discharged from this facility 02/02/22 for treatment of aspiration PNA, completed 7 days of antibiotics. Per chart no other complaints on admit.  On ED arrival patient was seen significantly hypoxic with SPO2 75% in triage per chart with hypothermia as well but hemodynamically stable. Lab work significant for glucose of 241, creatinine 1.44, AST 48, HS troponin 591 > 617, lactic 6.2 > 4.4, WBC 12.9, platelet 1, 192,  and INR 1.3. He was placed on BIPAP therapy on ED and has been unable to wean since. PCCM consulted for assistance in management   Pertinent  Medical History  Dementia  Seizures   Significant Hospital Events: Including procedures, antibiotic start and stop dates in addition to other pertinent events   8/16 presented for SOB and weakness, admitted for sepsis felt secondary to recurrent pneumonia. BIPAP dependent   Interim History / Subjective:  Seen lying in bed on BIPAP minimally interactive   Objective   Blood pressure 94/71, pulse 81, temperature (!) 96.4 F (35.8 C), temperature source Rectal, resp. rate (!) 34, height 6' (1.829 m), weight 45 kg, SpO2 96 %.        Intake/Output Summary (Last 24 hours) at Feb 23, 2022 1015 Last data filed at 2022/02/23 1937 Gross per 24 hour  Intake 2900 ml  Output --  Net 2900 ml   Filed Weights   02/18/2022 2307  Weight: 45 kg    Examination: General: Acute on chronically ill appearing very deconditioned frail elderly male lying in bed on BIPAP, in NAD HEENT: [place]/AT, MM pink/moist, PERRL,  Neuro: Alert and oriented x3, non-focal  CV: s1s2 regular  rate and rhythm, no murmur, rubs, or gallops,  PULM:  Mechanical breath sounds bilaterally with BIPAP in place, oxygen saturations 95-97% on 50% FIO2  GI: soft, bowel sounds active in all 4 quadrants, non-tender, non-distended Extremities: warm/dry, no edema  Skin: no rashes or lesions  Resolved Hospital Problem list     Assessment & Plan:  Acute hypoxic respiratory failure  -CT chest/ABD/Pelvis with some patchy infiltrates bilaterally -Recently treated for aspiration PNA with 7 days of Augmentin upon discharge  Pulmonary nodule  -3 mm left upper lobe pulmonary nodule Pulmonary artery hypertension  -ECHO 02/01/22 with EF 65-70% with severely elevated pulmonary artery systolic pressure  Severe deconditioning with severe malnutrition/failure to thrive  -BMI 13, cachetic on exam  P: CT chest and physical exam does not support severe pneumonia picture but reasonable to continue empiric antibiotics currently Low threshold to wean/stop antibiotics  Continue BIPAP wean to HFNC as able  Monitor volume status closely does not appear overloaded on exam  Optimize electrolytes  Follow RVP  Check Procal  Intermittently follow CXR and ABG  Elevate HOB  When safe obtain SLP to assess swallow  Aspiration precautions  Given severe deconditioning with failure to thrive GOC discussion need to occur, appears palliative care has been consulted Patient would be a poor candidate for intubation   Rest of acute and chronic conditions managed per primary   Best Practice (right click and "Reselect all SmartList Selections" daily)  Per primary   Labs  CBC: Recent Labs  Lab 02/23/2022 2322  WBC 12.9*  NEUTROABS 10.9*  HGB 13.7  HCT 43.4  MCV 100.7*  PLT 1,192*    Basic Metabolic Panel: Recent Labs  Lab 02/12/2022 2322  NA 136  K 4.3  CL 102  CO2 20*  GLUCOSE 241*  BUN 18  CREATININE 1.44*  CALCIUM 9.4   GFR: Estimated Creatinine Clearance: 24.3 mL/min (A) (by C-G formula based on SCr  of 1.44 mg/dL (H)). Recent Labs  Lab 02/23/2022 2322 03-15-22 0131  WBC 12.9*  --   LATICACIDVEN 6.2* 4.4*    Liver Function Tests: Recent Labs  Lab 02/23/2022 2322  AST 48*  ALT 21  ALKPHOS 86  BILITOT 0.6  PROT 8.8*  ALBUMIN 3.6   No results for input(s): "LIPASE", "AMYLASE" in the last 168 hours. No results for input(s): "AMMONIA" in the last 168 hours.  ABG    Component Value Date/Time   PHART 7.48 (H) Mar 15, 2022 0903   PCO2ART 31 (L) 2022-03-15 0903   PO2ART 59 (L) 03-15-22 0903   HCO3 23.1 15-Mar-2022 0903   TCO2 32 02/12/2008 0212   ACIDBASEDEF 2.8 (H) 02/22/2022 2350   O2SAT 89.7 03/15/22 0903     Coagulation Profile: Recent Labs  Lab 02/25/2022 2322  INR 1.3*    Cardiac Enzymes: No results for input(s): "CKTOTAL", "CKMB", "CKMBINDEX", "TROPONINI" in the last 168 hours.  HbA1C: No results found for: "HGBA1C"  CBG: No results for input(s): "GLUCAP" in the last 168 hours.  Review of Systems:   Unable to assess   Past Medical History:  He,  has a past medical history of Dementia (HCC), Gait abnormality (02/25/2019), Left carotid bruit (02/25/2019), and Seizures (HCC).   Surgical History:   Past Surgical History:  Procedure Laterality Date   FOOT SURGERY       Social History:   reports that he has never smoked. His smokeless tobacco use includes chew. He reports that he does not drink alcohol and does not use drugs.   Family History:  His family history includes Dementia in his mother; Other in his father.   Allergies No Known Allergies   Home Medications  Prior to Admission medications   Medication Sig Start Date End Date Taking? Authorizing Provider  amoxicillin-clavulanate (AUGMENTIN) 875-125 MG tablet Take 1 tablet by mouth 2 (two) times daily. 02/02/22  Yes Meredeth Ide, MD  divalproex (DEPAKOTE) 500 MG DR tablet One tablet in the morning and 2 in the evening Patient taking differently: Take 1,500 mg by mouth at bedtime. 04/27/19  Yes  Glean Salvo, NP  donepezil (ARICEPT) 5 MG tablet Take 5 mg by mouth every morning.    Yes [provider]  furosemide (LASIX) 20 MG tablet Take 20 mg by mouth daily as needed for edema. 01/10/20  Yes [provider]  Iron-FA-B Cmp-C-Biot-Probiotic (FUSION PLUS) CAPS Take 1 capsule by mouth daily.   Yes [provider]  levocetirizine (XYZAL) 5 MG tablet Take 5 mg by mouth every evening.   Yes [provider]  linaclotide (LINZESS) 72 MCG capsule Take 72 mcg by mouth daily before breakfast.   Yes [provider]  melatonin 5 MG TABS Take 5 mg by mouth at bedtime.   Yes [provider]  mirtazapine (REMERON) 7.5 MG tablet Take 7.5 mg by mouth at bedtime.   Yes [provider]  phenytoin (DILANTIN) 100 MG ER capsule TAKE 1 CAPSULE BY MOUTH TWICE A DAY Patient taking differently: Take  100-200 mg by mouth See admin instructions. Take 1 capsule in the morning then take 2 capsules in the evening 05/02/20  Yes Glean Salvo, NP  feeding supplement (ENSURE ENLIVE / ENSURE PLUS) LIQD Take 237 mLs by mouth 3 (three) times daily between meals. 02/02/22   Meredeth Ide, MD  memantine (NAMENDA) 10 MG tablet TAKE 1 TABLET BY MOUTH TWICE A DAY Patient not taking: Reported on 02/01/2022 03/14/20   Glean Salvo, NP     Critical care time: NA  Steaven Wholey D. Tiburcio Pea, NP-C [facility] Personal contact information can be found on Amion  02-26-22, 11:33 AM

## 2022-03-02 NOTE — Consult Note (Signed)
Consultation Note Date: Mar 03, 2022   Patient Name: Samuel Kirk  DOB: Mar 17, 1935  MRN: [id number]  Age / Sex: 87 y.o., male  PCP: Nolene Ebbs, MD Referring Physician: Jonnie Finner, DO  Reason for Consultation: Establishing goals of care  HPI/Patient Profile: 87 y.o. male  with past medical history of dementia, seizures, recent hospital admission 8/2-8/4 for aspiration pneumonia admitted on 02/06/2022 with shortness of breath and weakness due to sepsis pneumonia with acute kidney injury and requiring BiPAP.   Clinical Assessment and Goals of Care: I met today at Samuel Kirk's bedside after reviewing records. Recent admission for pneumonia with concern for aspiration noted. No clear notations for baseline functional or swallow status. No family initially at bedside. I reached out to daughter, Lindell Noe, who Ms. Bents is noted to live with and she is primary caregiver and I left voicemail. I reached out to daughter, Salvatore Marvel, and I was able to speak with her over the phone. Salvatore Marvel shares that there are many family members and children. Salvatore Marvel shares that she is HCPOA but she has given decision making rights to [place] at times since [name] is limited in her physical ability to be here. I explained my concerns that her father is critically ill and I am very concerned about his breathing. I expressed my concern that we need to be prepared and make some decisions if he were to get worse instead of better. I discuss with [name] code status and my concern that if he declined to where his heart and/or lungs fail him we need to be prepared on how to react and best treat him. We discussed this further and poor outcomes expected if we were to provide resuscitative measures. [name] understands and shares that she does not feel this would be what her father would desire but wishes to discuss further with some of her family. I provided  [name] my contact info to let me know her decision.   I was later called to bedside noting that there are multiple family members present. When I arrive PCCM Gerald Leitz, NP is having conversation with a few family members (including [place]) in the room. Family are able to clarify their wishes for what they feel Samuel Kirk would want for himself. They agree with DNR status but allowing vasopressor support but they would not want to escalate to placing central line. They wish to continue conservative interventions and BiPAP to allow for any chance of improvement. They also agree with small doses of medication to minimize his suffering but also to optimize his ability to cooperate with interventions. They want time for outcomes to allow for improvement if possible but they do not want him to suffer.   Forde Dandy was had with multiple family members in waiting room and updated to plan of care and course of action. Other family was given opportunity to ask questions. Feeding tube was brought up and we explained the risks outweigh the benefits at this time but we can reconsider this tomorrow or in the future  if he is showing signs of improvement and benefits outweigh risks. Family agree with plan.   All questions/concerns addressed. Emotional support provided.   Primary Decision Maker HCPOA [name] and Lindell Noe    SUMMARY OF RECOMMENDATIONS   - DNR decided - Continue current interventions and conservative care - NO central line but low dose peripheral vasopressors desired - Low dose medications for comfort okay as needed  Code Status/Advance Care Planning: DNR   Symptom Management:  Per PCCM.    Prognosis:  Overall prognosis poor.   Discharge Planning: To Be Determined      Primary Diagnoses: Present on Admission:  Pneumonia of both lungs due to infectious organism  Elevated troponin  Dementia (HCC)  Protein-calorie malnutrition, severe   I have reviewed the medical  record, interviewed the patient and family, and examined the patient. The following aspects are pertinent.  Past Medical History:  Diagnosis Date   Dementia ([place])    Gait abnormality 02/25/2019   Left carotid bruit 02/25/2019   Seizures (HCC)    Social History   Socioeconomic History   Marital status: Married    Spouse name: Not on file   Number of children: 2   Years of education: 7th grade   Highest education level: Not on file  Occupational History   Occupation: Retired  Tobacco Use   Smoking status: Never   Smokeless tobacco: Current    Types: Chew  Substance and Sexual Activity   Alcohol use: No   Drug use: No   Sexual activity: Not on file  Other Topics Concern   Not on file  Social History Narrative   Lives at home with his daughter.   Left-handed.   No daily caffeine use.         Social Determinants of Health   Financial Resource Strain: Not on file  Food Insecurity: Not on file  Transportation Needs: Not on file  Physical Activity: Not on file  Stress: Not on file  Social Connections: Not on file   Family History  Problem Relation Age of Onset   Dementia Mother        died at age 43   Other Father        unsure of history - died at age 35   Scheduled Meds:  phenytoin (DILANTIN) IV  100 mg Intravenous Q8H   Continuous Infusions:  ceFEPime (MAXIPIME) IV     lactated ringers 150 mL/hr at 2022-02-27 0046   valproate sodium     PRN Meds:. No Known Allergies Review of Systems  Unable to perform ROS: Dementia    Physical Exam Vitals and nursing note reviewed.  Constitutional:      General: He is not in acute distress.    Appearance: He is ill-appearing.  Cardiovascular:     Rate and Rhythm: Normal rate.  Pulmonary:     Effort: Tachypnea, accessory muscle usage and respiratory distress present.     Comments: Placed back on BiPAP Abdominal:     General: Abdomen is flat.     Palpations: Abdomen is soft.  Neurological:     Mental Status: He is  alert.     Comments: Restless, confused     Vital Signs: BP 94/71   Pulse 81   Temp (!) 96.4 F (35.8 C) (Rectal)   Resp (!) 34   Ht 6' (1.829 m)   Wt 45 kg   SpO2 96%   BMI 13.45 kg/m  Pain Scale: 0-10   Pain Score: 0-No  pain   SpO2: SpO2: 96 % O2 Device:SpO2: 96 % O2 Flow Rate: .   IO: Intake/output summary:  Intake/Output Summary (Last 24 hours) at 03/11/22 0944 Last data filed at 11-Mar-2022 0737 Gross per 24 hour  Intake 2900 ml  Output --  Net 2900 ml    LBM:   Baseline Weight: Weight: 45 kg Most recent weight: Weight: 45 kg     Palliative Assessment/Data:     Time Total: 75 min  Greater than 50%  of this time was spent counseling and coordinating care related to the above assessment and plan.  Signed by: Vinie Sill, NP Palliative Medicine Team Pager # [phone] (M-F 8a-5p) Team Phone # [phone] (Nights/Weekends)

## 2022-03-02 NOTE — Progress Notes (Signed)
Called by RN concerning the patient's SpO2 dropping and if there were any adjustments to make on the BiPAP to help SpO2 improvements.  EPAP increased to 6 cm H2O and IPAP increased to 12 cm H2O (Patient already on 100% FiO2).

## 2022-03-02 NOTE — Progress Notes (Signed)
Pt set up on bipap and ABG drawn and sent to lab.

## 2022-03-02 NOTE — ED Notes (Signed)
Patient currently pulling at lines and not redirectable. Dr Ronaldo Miyamoto paged.

## 2022-03-02 NOTE — Progress Notes (Signed)
Pt isn't available right now to do the EEG.

## 2022-03-02 NOTE — Death Summary Note (Signed)
DEATH SUMMARY   Patient Details  Name: Gina Costilla MRN: [id number] DOB: 10-03-1934  Admission/Discharge Information   Admit Date:  01-Mar-2022  Date of Death: Date of Death: Mar 02, 2022  Time of Death: Time of Death: 10-21-2233  Length of Stay: 1  Referring Physician: Fleet Contras, MD   Reason(s) for Hospitalization  Admitted with shortness of breath on 2022-03-02  Diagnoses  Preliminary cause of death:  Pneumonia Secondary Diagnoses (including complications and co-morbidities):  Principal Problem:   Pneumonia of both lungs due to infectious organism Active Problems:   Seizures (HCC)   Dementia (HCC)   Elevated troponin   Protein-calorie malnutrition, severe   Sepsis (HCC)   AKI (acute kidney injury) (HCC)   Thrombocytosis   Hyperglycemia   Elevated TSH Severe pulmonary hypertension  Brief Hospital Course (including significant findings, care, treatment, and services provided and events leading to death)  Echo Furukawa is a 87 y.o. year old male who was admitted to the hospital with worsening shortness of breath, increased weakness. Recently hospitalized for treatment of aspiration pneumonia completed antibiotics Came back to the hospital with worsening shortness of breath, hypoxemia.  Patient was started on BiPAP therapy Started on antibiotic therapy. Patient is very frail at baseline with severe protein calorie malnutrition, failure to thrive, advanced dementia.  Goals of care discussions was had with family-patient was made DO NOT RESUSCITATE, was to continue BiPAP and pressors that were already in place. Despite ongoing care patient continued to deteriorate and succumbed to his illness on March 02, 2022 at 2235 hours  Pronounced at 2235 hrs. Cause of death-pneumonia   Pertinent Labs and Studies  Significant Diagnostic Studies CT CHEST ABDOMEN PELVIS W CONTRAST  Result Date: 03/02/2022 CLINICAL DATA:  Sepsis, shortness of breath, increasing weakness. EXAM: CT CHEST, ABDOMEN, AND  PELVIS WITH CONTRAST TECHNIQUE: Multidetector CT imaging of the chest, abdomen and pelvis was performed following the standard protocol during bolus administration of intravenous contrast. RADIATION DOSE REDUCTION: This exam was performed according to the departmental dose-optimization program which includes automated exposure control, adjustment of the mA and/or kV according to patient size and/or use of iterative reconstruction technique. CONTRAST:  76mL OMNIPAQUE IOHEXOL 300 MG/ML  SOLN COMPARISON:  12/31/2018. FINDINGS: CT CHEST FINDINGS Cardiovascular: The heart is enlarged and there is no pericardial effusion. Scattered coronary artery calcifications are noted. 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 The pulmonary trunk is normal in caliber. Mediastinum/Nodes: No mediastinal, hilar, or axillary lymphadenopathy. The thyroid gland, trachea, and esophagus are within normal limits. Lungs/Pleura: Frothy debris is present in the right mainstem bronchus. Patchy infiltrates are noted at the lung bases. A pulmonary cyst is noted in the left upper lobe. A 3 mm ground-glass nodule is present in the left upper lobe, axial image 77. Mild atelectasis or scarring is noted in the upper lobes bilaterally. No effusion or pneumothorax. Musculoskeletal: Evaluation is limited due to respiratory motion artifact. No acute fracture. CT ABDOMEN PELVIS FINDINGS Hepatobiliary: No focal liver abnormality. Mild periportal edema is noted. The gallbladder is decompressed. No biliary ductal dilatation. Pancreas: Unremarkable. No pancreatic ductal dilatation or surrounding inflammatory changes. Spleen: Normal in size without focal abnormality. Adrenals/Urinary Tract: No adrenal nodule or mass. A cystic structure is present in the region of the lower pole of the right kidney, possible renal cyst versus chronic hydronephrosis as compared with the previous exam. There  is diffuse bladder wall thickening. Stomach/Bowel: Stomach is within normal limits. Appendix is not seen.  No evidence of bowel wall thickening, distention, or inflammatory changes. No free air or pneumatosis. Vascular/Lymphatic: Aortic atherosclerosis. No enlarged abdominal or pelvic lymph nodes. Reproductive: Prostate is unremarkable. Other: No abdominopelvic ascites. Musculoskeletal: Bilateral pars defects are noted at L5 with grade 1 anterolisthesis at L5-S1. No acute osseous abnormality. IMPRESSION: 1. Patchy infiltrates at the lung bases bilaterally, possible atelectasis or infiltrate. 2. Diffuse bladder wall thickening suggesting infectious or inflammatory cystitis. 3. 3 mm left upper lobe pulmonary nodule. No follow-up recommended. This recommendation follows the consensus statement: Guidelines for Management of Incidental Pulmonary Nodules Detected on CT Images: From the Fleischner Society 2017; Radiology 2017; 284:228-243. 4. Aortic atherosclerosis. 5. Remaining incidental findings as described above. Electronically Signed   By: Thornell Sartorius M.D.   On: 02/01/2022 03:40   DG Chest Port 1 View  Result Date: 2022-02-25 CLINICAL DATA:  Shortness of breath and hypoxia, initial encounter EXAM: PORTABLE CHEST 1 VIEW COMPARISON:  01/31/2022 FINDINGS: Cardiac shadow is stable. Aortic calcifications are noted. The lungs are well aerated bilaterally. No focal infiltrate or effusion is seen. No acute bony abnormality is noted. IMPRESSION: No acute abnormality noted. Electronically Signed   By: Alcide Clever M.D.   On: 25-Feb-2022 23:44   ECHOCARDIOGRAM COMPLETE  Result Date: 02/01/2022    ECHOCARDIOGRAM REPORT   Patient Name:   Jaxon Gabor Date of Exam: 02/01/2022 Medical Rec #:  [id number]   Height:       72.0 in Accession #:    [id number]  Weight:       98.5 lb Date of Birth:  [birth year]   BSA:          1.577 m Patient Age:    86 years    BP:           111/67 mmHg Patient Gender: M           HR:           76 bpm.  Exam Location:  Inpatient Procedure: 2D Echo, Cardiac Doppler and Color Doppler Indications:    R07.9* Chest pain, unspecified. Elevated troponin.  History:        Patient has no prior history of Echocardiogram examinations.                 Signs/Symptoms:Alzheimer's, Altered Mental Status, Shortness of                 Breath and Dyspnea.  Sonographer:    Sheralyn Boatman RDCS Referring Phys: [id number] ERIC CHEN  Sonographer Comments: Technically difficult study due to poor echo windows and suboptimal parasternal window. Patient has very thin habitus. Attempted to turn. IMPRESSIONS  1. Left ventricular ejection fraction, by estimation, is 65 to 70%. The left ventricle has normal function. The left ventricle has no regional wall motion abnormalities. There is moderate asymmetric left ventricular hypertrophy of the septal segment. Left ventricular diastolic parameters are indeterminate.  2. Right ventricular systolic function is low normal. The right ventricular size is mildly enlarged. There is severely elevated pulmonary artery systolic pressure.  3. Right atrial size was mildly dilated.  4. The mitral valve is normal in structure. No evidence of mitral valve regurgitation. No evidence of mitral stenosis.  5. Tricuspid valve regurgitation is mild to moderate.  6. The aortic valve is calcified. Aortic valve regurgitation is not visualized. Aortic valve sclerosis/calcification is present, without any evidence of aortic stenosis.  7. The inferior vena cava is dilated in size with <50% respiratory variability, suggesting right atrial pressure  of 15 mmHg. FINDINGS  Left Ventricle: Left ventricular ejection fraction, by estimation, is 65 to 70%. The left ventricle has normal function. The left ventricle has no regional wall motion abnormalities. The left ventricular internal cavity size was small. There is moderate  asymmetric left ventricular hypertrophy of the septal segment. Left ventricular diastolic parameters are indeterminate.  Right Ventricle: The right ventricular size is mildly enlarged. No increase in right ventricular wall thickness. Right ventricular systolic function is low normal. There is severely elevated pulmonary artery systolic pressure. The tricuspid regurgitant velocity is 4.91 m/s, and with an assumed right atrial pressure of 15 mmHg, the estimated right ventricular systolic pressure is 111.4 mmHg. Left Atrium: Left atrial size was normal in size. Right Atrium: Right atrial size was mildly dilated. Pericardium: There is no evidence of pericardial effusion. Presence of epicardial fat layer. Mitral Valve: The mitral valve is normal in structure. No evidence of mitral valve regurgitation. No evidence of mitral valve stenosis. Tricuspid Valve: The tricuspid valve is normal in structure. Tricuspid valve regurgitation is mild to moderate. No evidence of tricuspid stenosis. Aortic Valve: The aortic valve is calcified. Aortic valve regurgitation is not visualized. Aortic valve sclerosis/calcification is present, without any evidence of aortic stenosis. Pulmonic Valve: The pulmonic valve was not well visualized. Pulmonic valve regurgitation is trivial. No evidence of pulmonic stenosis. Aorta: The aortic root is normal in size and structure. Venous: The inferior vena cava is dilated in size with less than 50% respiratory variability, suggesting right atrial pressure of 15 mmHg. IAS/Shunts: No atrial level shunt detected by color flow Doppler.  LEFT VENTRICLE PLAX 2D LVIDd:         2.20 cm     Diastology LVIDs:         1.65 cm     LV e' medial:    5.98 cm/s LV PW:         1.20 cm     LV E/e' medial:  9.7 LV IVS:        1.75 cm     LV e' lateral:   14.65 cm/s LVOT diam:     2.30 cm     LV E/e' lateral: 3.9 LV SV:         59 LV SV Index:   37 LVOT Area:     4.15 cm  LV Volumes (MOD) LV vol d, MOD A2C: 48.5 ml LV vol d, MOD A4C: 54.0 ml LV vol s, MOD A2C: 15.9 ml LV vol s, MOD A4C: 10.4 ml LV SV MOD A2C:     32.6 ml LV SV MOD A4C:      54.0 ml LV SV MOD BP:      38.8 ml RIGHT VENTRICLE            IVC RV S prime:     9.20 cm/s  IVC diam: 2.30 cm TAPSE (M-mode): 1.8 cm LEFT ATRIUM           Index        RIGHT ATRIUM           Index LA diam:      2.55 cm 1.62 cm/m   RA Area:     18.30 cm LA Vol (A4C): 31.5 ml 19.98 ml/m  RA Volume:   60.90 ml  38.62 ml/m  AORTIC VALVE             PULMONIC VALVE LVOT Vmax:   78.80 cm/s  PR End Diast Vel: 1.28 msec LVOT Vmean:  50.700 cm/s LVOT VTI:    0.142 m  AORTA Ao Root diam: 3.80 cm Ao Asc diam:  3.90 cm MITRAL VALVE               TRICUSPID VALVE MV Area (PHT): 3.24 cm    TR Peak grad:   96.4 mmHg MV Decel Time: 234 msec    TR Vmax:        491.00 cm/s MV E velocity: 57.75 cm/s MV A velocity: 73.35 cm/s  SHUNTS MV E/A ratio:  0.79        Systemic VTI:  0.14 m                            Systemic Diam: 2.30 cm Kardie Tobb DO Electronically signed by Thomasene Ripple DO Signature Date/Time: 02/01/2022/3:24:18 PM    Final    CT Angio Chest PE W and/or Wo Contrast  Result Date: 01/31/2022 CLINICAL DATA:  Dyspnea, high probability PE EXAM: CT ANGIOGRAPHY CHEST WITH CONTRAST TECHNIQUE: Multidetector CT imaging of the chest was performed using the standard protocol during bolus administration of intravenous contrast. Multiplanar CT image reconstructions and MIPs were obtained to evaluate the vascular anatomy. RADIATION DOSE REDUCTION: This exam was performed according to the departmental dose-optimization program which includes automated exposure control, adjustment of the mA and/or kV according to patient size and/or use of iterative reconstruction technique. CONTRAST:  75mL OMNIPAQUE IOHEXOL 350 MG/ML SOLN COMPARISON:  Radiographs earlier today FINDINGS: Cardiovascular: Satisfactory opacification of the pulmonary arteries to the segmental level. No evidence of pulmonary embolism. Enlarged right ventricle. No pericardial effusion. Aortic atherosclerotic calcification. Mediastinum/Nodes: Layering secretions along the  right lateral trachea. No thoracic adenopathy by size. Lungs/Pleura: Right-greater-than-left airspace opacities likely infectious/inflammatory in nature. Mild scarring in the anterior upper lobes. Lower lobe bronchial wall thickening. Pulmonary cyst in the anterior lingula. No pleural effusion or pneumothorax. Upper Abdomen: Partially visualized large right renal cyst. No acute abnormality. Musculoskeletal: Thoracic kyphosis. Extravasated contrast within the soft tissues of the visualized left upper extremity. Review of the MIP images confirms the above findings. IMPRESSION: 1. Negative for acute pulmonary embolism. 2. Infectious/inflammatory airspace opacities in the bilateral lower lobes may be due to pneumonia or aspiration pneumonitis. 3. Extravasated contrast within the soft tissues of the visualized left upper extremity. This was called to the ordering clinician or representative by the Radiology Department at the imaging location. Electronically Signed   By: Minerva Fester M.D.   On: 01/31/2022 19:24   CT HEAD WO CONTRAST ( )  Result Date: 01/31/2022 CLINICAL DATA:  Mental status change.  Dementia. EXAM: CT HEAD WITHOUT CONTRAST TECHNIQUE: Contiguous axial images were obtained from the base of the skull through the vertex without intravenous contrast. RADIATION DOSE REDUCTION: This exam was performed according to the departmental dose-optimization program which includes automated exposure control, adjustment of the mA and/or kV according to patient size and/or use of iterative reconstruction technique. COMPARISON:  CT head 12/16/2019 FINDINGS: Brain: Moderate atrophy. Negative for hydrocephalus. Mild white matter hypodensity. Negative for acute infarct, hemorrhage, mass Vascular: Negative for hyperdense vessel Skull: Negative Sinuses/Orbits: Negative Other: None IMPRESSION: No acute abnormality. Generalized atrophy and mild chronic microvascular ischemic change in the white matter Electronically Signed    By: Marlan Palau M.D.   On: 01/31/2022 18:27   DG Chest 2 View  Result Date: 01/31/2022 CLINICAL DATA:  Shortness of breath. EXAM: CHEST - 2 VIEW COMPARISON:  February 07, 2019 FINDINGS: The  study is limited secondary to patient rotation. The heart size and mediastinal contours are within normal limits. The lungs are inflated. There is no evidence of acute infiltrate, pleural effusion or pneumothorax. The visualized skeletal structures are unremarkable. IMPRESSION: COPD without evidence of active cardiopulmonary disease. Electronically Signed   By: Aram Candelahaddeus  Houston M.D.   On: 01/31/2022 15:54    Microbiology Recent Results (from the past 240 hour(s))  Urine Culture     Status: None   Collection Time: 12/23/21  3:23 AM   Specimen: In/Out Cath Urine  Result Value Ref Range Status   Specimen Description   Final    IN/OUT CATH URINE Performed at [facility], [address] [place], [place] [address]    Special Requests   Final    NONE Performed at [facility], [address] [place], [place] [address]    Culture   Final    NO GROWTH Performed at [facility], [address] [place], [place] [address]    Report Status 02/15/2022 FINAL  Final  Blood Culture (routine x 2)     Status: None (Preliminary result)   Collection Time: 12/23/21 11:22 PM   Specimen: BLOOD  Result Value Ref Range Status   Specimen Description   Final    BLOOD LEFT ANTECUBITAL Performed at [facility], [address] [place], [place] [address]    Special Requests   Final    BOTTLES DRAWN AEROBIC AND ANAEROBIC Blood Culture adequate volume Performed at [facility], [address] [place], [place] [address]    Culture   Final    NO GROWTH 3 DAYS Performed at [facility], [address] [place], [place] [address]    Report Status PENDING  Incomplete  SARS Coronavirus 2 by RT PCR (hospital order, performed in [facility] lab) *cepheid single result test* Anterior Nasal Swab     Status: None   Collection Time: 01/30/2022 10:40 AM   Specimen: Anterior Nasal Swab  Result Value Ref Range Status   SARS Coronavirus 2 by RT PCR NEGATIVE NEGATIVE Final    Comment: (NOTE) SARS-CoV-2 target nucleic acids are NOT DETECTED.  The SARS-CoV-2 RNA is generally detectable in upper and lower respiratory specimens during the acute phase of infection. The lowest concentration of SARS-CoV-2 viral copies this assay can detect is 250 copies / mL. A negative result does not preclude SARS-CoV-2 infection and should not be used as the sole basis for treatment or other patient management decisions.  A negative result may occur with improper specimen collection / handling, submission of specimen other than nasopharyngeal swab, presence of viral mutation(s) within the areas targeted by this assay, and inadequate number of viral copies (<250 copies / mL). A negative result must be combined with clinical observations, patient history, and epidemiological information.  Fact Sheet for Patients:   RoadLapTop.co.zahttps://www.fda.gov/media/158405/download  Fact Sheet for Healthcare Providers: http://kim-miller.com/https://www.fda.gov/media/158404/download  This test is not yet approved or  cleared by the [place] FDA and has been authorized for detection and/or diagnosis of SARS-CoV-2 by FDA under an Emergency Use Authorization (EUA).  This EUA [place] remain in effect (meaning this test can be used) for the duration of the COVID-19 declaration under Section 564(b)(1) of the Act, 21 U.S.C. section 360bbb-3(b)(1), unless the authorization is terminated or revoked sooner.  Performed at [facility], [address] [place], [place] [address]   Respiratory (~20 pathogens) panel  by PCR     Status: None   Collection Time: 02/10/2022 10:40 AM   Specimen: Anterior Nasal Swab; Respiratory  Result Value Ref Range Status   Adenovirus NOT  DETECTED NOT DETECTED Final   Coronavirus 229E NOT DETECTED NOT DETECTED Final    Comment: (NOTE) The Coronavirus on the Respiratory Panel, DOES NOT test for the novel  Coronavirus (2019 nCoV)    Coronavirus HKU1 NOT DETECTED NOT DETECTED Final   Coronavirus NL63 NOT DETECTED NOT DETECTED Final   Coronavirus OC43 NOT DETECTED NOT DETECTED Final   Metapneumovirus NOT DETECTED NOT DETECTED Final   Rhinovirus / Enterovirus NOT DETECTED NOT DETECTED Final   Influenza A NOT DETECTED NOT DETECTED Final   Influenza B NOT DETECTED NOT DETECTED Final   Parainfluenza Virus 1 NOT DETECTED NOT DETECTED Final   Parainfluenza Virus 2 NOT DETECTED NOT DETECTED Final   Parainfluenza Virus 3 NOT DETECTED NOT DETECTED Final   Parainfluenza Virus 4 NOT DETECTED NOT DETECTED Final   Respiratory Syncytial Virus NOT DETECTED NOT DETECTED Final   Bordetella pertussis NOT DETECTED NOT DETECTED Final   Bordetella Parapertussis NOT DETECTED NOT DETECTED Final   Chlamydophila pneumoniae NOT DETECTED NOT DETECTED Final   Mycoplasma pneumoniae NOT DETECTED NOT DETECTED Final    Comment: Performed at [facility], [address], [place], [place] [address]  MRSA Next Gen by PCR, Nasal     Status: None   Collection Time: 02/10/2022 10:41 AM   Specimen: Anterior Nasal Swab  Result Value Ref Range Status   MRSA by PCR Next Gen NOT DETECTED NOT DETECTED Final    Comment: (NOTE) The GeneXpert MRSA Assay (FDA approved for NASAL specimens only), is one component of a comprehensive MRSA colonization surveillance program. It is not intended to diagnose MRSA infection nor to guide or monitor treatment for MRSA infections. Test performance is not FDA approved in patients less than 71 years old. Performed at [facility], [address], [place], [place] [address]   Blood Culture (routine x 2)     Status: None (Preliminary result)   Collection Time: 01/31/2022  2:06 PM   Specimen: BLOOD  Result  Value Ref Range Status   Specimen Description   Final    BLOOD BLOOD LEFT HAND Performed at [facility], [address], [place], [place] [address]    Special Requests   Final    IN PEDIATRIC BOTTLE Blood Culture results may not be optimal due to an inadequate volume of blood received in culture bottles   Culture   Final    NO GROWTH 3 DAYS Performed at [facility], [address] [place], [place] [address]    Report Status PENDING  Incomplete    Lab Basic Metabolic Panel: Recent Labs  Lab 19-Feb-2022 2322 02/10/2022 1038  NA 136 137  K 4.3 5.1  CL 102 107  CO2 20* 18*  GLUCOSE 241* 72  BUN 18 17  CREATININE 1.44* 1.06  CALCIUM 9.4 8.4*   Liver Function Tests: Recent Labs  Lab 2022/02/19 2322 02/05/2022 1038  AST 48* 78*  ALT 21 29  ALKPHOS 86 86  BILITOT 0.6 0.6  PROT 8.8* 7.0  ALBUMIN 3.6 2.9*   No results for input(s): "LIPASE", "AMYLASE" in the last 168 hours. No results for input(s): "AMMONIA" in the last 168 hours. CBC: Recent Labs  Lab Feb 19, 2022 2322 02/01/2022 1038  WBC 12.9* 14.4*  NEUTROABS 10.9*  --  HGB 13.7 11.6*  HCT 43.4 35.9*  MCV 100.7* 99.2  PLT 1,192* 1,244*   Cardiac Enzymes: No results for input(s): "CKTOTAL", "CKMB", "CKMBINDEX", "TROPONINI" in the last 168 hours. Sepsis Labs: Recent Labs  Lab 03/14/22 2322 02/23/2022 0131 02/20/2022 1038 02/13/2022 1045 02/15/2022 1406  PROCALCITON  --   --   --   --  <0.10  WBC 12.9*  --  14.4*  --   --   LATICACIDVEN 6.2* 4.4*  --  3.5*  --     Procedures/Operations     Cheila Wickstrom A Aleph Nickson 02/17/2022, 7:34 AM

## 2022-03-02 NOTE — H&P (Addendum)
History and Physical    Patient: Samuel Kirk DS:[id number] DOB: Nov 11, 1934 DOA: 02/09/2022 DOS: the patient was seen and examined on 02/26/22 PCP: Nolene Ebbs, MD  Patient coming from: Home  Chief Complaint:  Chief Complaint  Patient presents with   Shortness of Breath   HPI: Samuel Kirk is a 87 y.o. male with medical history significant of seizures, dementia. Presenting with hypoxia. History is per chart review as patient is somnolent and no family is at bedside. Per EDP, he complained to his daughter about shortness of breath and "his chest" last night. He denied any other symptoms. No therapies were tried at home. Of note, he was just in the hospital for similar presentation and found to have aspiration PNA. He was discharged to home w/ augmentin to complete a 7-day abx course. When he complained of shortness of breath, his daughter immediately returned him to the ED.   Review of Systems: unable to review all systems due to the inability of the patient to answer questions. Past Medical History:  Diagnosis Date   Dementia ([place])    Gait abnormality 02/25/2019   Left carotid bruit 02/25/2019   Seizures ([facility])    Past Surgical History:  Procedure Laterality Date   FOOT SURGERY     Social History:  reports that he has never smoked. His smokeless tobacco use includes chew. He reports that he does not drink alcohol and does not use drugs.  No Known Allergies  Family History  Problem Relation Age of Onset   Dementia Mother        died at age 65   Other Father        unsure of history - died at age 85    Prior to Admission medications   Medication Sig Start Date End Date Taking? Authorizing Provider  amoxicillin-clavulanate (AUGMENTIN) 875-125 MG tablet Take 1 tablet by mouth 2 (two) times daily. 02/02/22  Yes Oswald Hillock, MD  divalproex (DEPAKOTE) 500 MG DR tablet One tablet in the morning and 2 in the evening Patient taking differently: Take 1,500 mg by mouth at bedtime.  04/27/19  Yes Suzzanne Cloud, NP  donepezil (ARICEPT) 5 MG tablet Take 5 mg by mouth every morning.    Yes [provider]  furosemide (LASIX) 20 MG tablet Take 20 mg by mouth daily as needed for edema. 01/10/20  Yes [provider]  Iron-FA-B Cmp-C-Biot-Probiotic (FUSION PLUS) CAPS Take 1 capsule by mouth daily.   Yes [provider]  levocetirizine (XYZAL) 5 MG tablet Take 5 mg by mouth every evening.   Yes [provider]  linaclotide (LINZESS) 72 MCG capsule Take 72 mcg by mouth daily before breakfast.   Yes [provider]  melatonin 5 MG TABS Take 5 mg by mouth at bedtime.   Yes [provider]  mirtazapine (REMERON) 7.5 MG tablet Take 7.5 mg by mouth at bedtime.   Yes [provider]  phenytoin (DILANTIN) 100 MG ER capsule TAKE 1 CAPSULE BY MOUTH TWICE A DAY Patient taking differently: Take 100-200 mg by mouth See admin instructions. Take 1 capsule in the morning then take 2 capsules in the evening 05/02/20  Yes Suzzanne Cloud, NP  feeding supplement (ENSURE ENLIVE / ENSURE PLUS) LIQD Take 237 mLs by mouth 3 (three) times daily between meals. 02/02/22   Oswald Hillock, MD  memantine (NAMENDA) 10 MG tablet TAKE 1 TABLET BY MOUTH TWICE A DAY Patient not taking: Reported on 02/01/2022 03/14/20  Glean Salvo, NP    Physical Exam: Vitals:   02/05/2022 0445 02/16/2022 0447 02/09/2022 0530 02/25/2022 0545  BP: 91/68  (!) 98/57 (!) 113/101  Pulse: 76  79 79  Resp: (!) 32  (!) 22 16  Temp:  (!) 96 F (35.6 C)    TempSrc:  Rectal    SpO2: 99%  100% 99%  Weight:      Height:       General: 87 y.o. ill appearing male resting in bed on Bipap Eyes: PERRL, normal sclera ENMT: Nares patent w/o discharge, orophaynx clear, dentition normal, ears w/o discharge/lesions/ulcers Neck: trachea midline, thin Cardiovascular: RRR, +S1, S2, no m/g/r, equal pulses throughout Respiratory: decreased at bases, no w/r/r,increased WOB on BiPap GI: BS+, NDNT,  no masses noted, no organomegaly noted MSK: No e/c/c, cachetic Neuro: somnolent, not participating in exam  Data Reviewed:  Lab Results  Component Value Date   NA 136 2022/02/28   K 4.3 02-28-2022   CO2 20 (L) Feb 28, 2022   GLUCOSE 241 (H) 2022/02/28   BUN 18 02/28/2022   CREATININE 1.44 (H) 2022/02/28   CALCIUM 9.4 02-28-22   GFRNONAA 48 (L) 02/28/2022   Trp 592 -> 617  Lactic acid  6.2 -> 4.4  Lab Results  Component Value Date   WBC 12.9 (H) 2022/02/28   HGB 13.7 Feb 28, 2022   HCT 43.4 February 28, 2022   MCV 100.7 (H) February 28, 2022   PLT 1,192 (HH) February 28, 2022   CT chest/ab/pelvis w/ contrast 1. Patchy infiltrates at the lung bases bilaterally, possible atelectasis or infiltrate. 2. Diffuse bladder wall thickening suggesting infectious or inflammatory cystitis. 3. 3 mm left upper lobe pulmonary nodule. No follow-up recommended. This recommendation follows the consensus statement: Guidelines for Management of Incidental Pulmonary Nodules Detected on CT Images: From the Fleischner Society 2017; Radiology 2017; 284:228-243. 4. Aortic atherosclerosis. 5. Remaining incidental findings as described above.  EKG: sinus, no st elevations  Assessment and Plan: Sepsis secondary to PNA Acute respiratory failure w/ hypoxia     - admitted to inpt, SDU     - continue broad spec abx     - trend lactic acid and procal     - follow Bld Cx, Ucx     - check COVID/flu/RVP, urine legionella/strep     - repeat ABG, wean BiPap as able, target SpO2 92%  UPDATE: rpt ABG shows pO2 59; spoke with PCCM, they Ar[place] evaluate. Appreciate assistance.  Elevated Trp     - trend trp     - ekg w/o st elevation     - recent echo showed normal LV and no wall motion abnormalities     - seen by cardiology 02/01/22 with similar presentation and deemed not a candidate for aggressive therapy; spoke with cards oncall (0830hrs) and still with the opinion of no aggressive therapy  AKI     - fluids     - watch  nephrotoxins     - imaging shows renal cyst vs chronic hydronephrosis; watch I&O for right now   Dementia     - resume home regimen when able to take PO  Seizure d/o     - convert home regimen to IV for now: valproic acid 500mg  q8h, dilantin 100mg  q8h     - check depakote and dilantin level     - check EEG  Severe protein-calorie malnutrition     - dietitian consult  Thrombocytosis     - reactive to infection? He's elevated going back to the  beginning of this month     - no splenomegaly noted on imaging     - treat infection and monitor for now; if persistent Samuel Kirk need to hematology input  Hyperglycemia     - check A1c  Elevated TSH     - in setting of acute illness     - check T4/T3 outpt  Advance Care Planning:   Code Status: FULL  Consults: PCCM  Family Communication: Attempted call to both contacts listed in chart. Received VM only.   Severity of Illness: The appropriate patient status for this patient is INPATIENT. Inpatient status is judged to be reasonable and necessary in order to provide the required intensity of service to ensure the patient's safety. The patient's presenting symptoms, physical exam findings, and initial radiographic and laboratory data in the context of their chronic comorbidities is felt to place them at high risk for further clinical deterioration. Furthermore, it is not anticipated that the patient Cailan be medically stable for discharge from the hospital within 2 midnights of admission.   * I certify that at the point of admission it is my clinical judgment that the patient Samuel Kirk require inpatient hospital care spanning beyond 2 midnights from the point of admission due to high intensity of service, high risk for further deterioration and high frequency of surveillance required.*  Author: Teddy Spike, DO February 15, 2022 7:10 AM  For on call review [email or web address].

## 2022-03-02 NOTE — Progress Notes (Signed)
Pharmacy Antibiotic Note  Samuel Kirk is a 87 y.o. male admitted on 02/24/2022 with sepsis, likely pulmonary source.  Pharmacy has been consulted for and Cefepime dosing. AKI noted on admit (baseline SCr ~1).  Plan: Cefepime 2 g IV q12 hr  Pharmacy [place] sign off, following peripherally for culture results or dose adjustments. Please reconsult if a change in clinical status warrants re-evaluation of dosage.  Height: 6' (182.9 cm) Weight: 45 kg (99 lb 3.3 oz) IBW/kg (Calculated) : 77.6  Temp (24hrs), Avg:95.1 F (35.1 C), Min:93.5 F (34.2 C), Max:96 F (35.6 C)  Recent Labs  Lab 02/08/2022 2322 Mar 12, 2022 0131  WBC 12.9*  --   CREATININE 1.44*  --   LATICACIDVEN 6.2* 4.4*    Estimated Creatinine Clearance: 24.3 mL/min (A) (by C-G formula based on SCr of 1.44 mg/dL (H)).    No Known Allergies  Thank you for allowing pharmacy to be a part of this patient's care.  Myya Meenach A 03-12-22 8:42 AM

## 2022-03-02 NOTE — Progress Notes (Signed)
Notified Lab that ABG being sent for analysis. 

## 2022-03-02 NOTE — IPAL (Signed)
  Interdisciplinary Goals of Care Family Meeting   Date carried out: 02/06/2022  Location of the meeting: Bedside  Member's involved: Nurse Practitioner, Bedside Registered Nurse, Family Member or next of kin, and Palliative care team member  Durable Power of Attorney or acting medical decision maker: Shared among sisters Samuel Kirk and Samuel Kirk but patient has many other family members that presented to the hospital for GOC discussion   Discussion: I started the GOC talk with Samuel Kirk and Samuel Kirk at bedside as they hold join POA. We discussed Samuel's current critical condition and possible projected outcomes. Once all updated were provided and all questions were answered a discussion on code status was held. Samuel Kirk and Somers would like to continue current measures but ensure we focus on quality of life as well. With that said decision was made to change code status to DNR but the family would like to continue with BIPAP therapy and would be ok with peripheral pressors (no central access).   This plan was relayed to all other family at the request of Samuel Kirk and Samuel Kirk and all members of the family agreed to plan.  There was additional discussion held on NG tube placement/tube feeding and collective decision was made to reassess clinical status daily to determine best timing for initiation. At this time the risk for placement far exceeds the benefits.   Code status: Limited Code or DNR with short term BIPAP and pressors use   Disposition: Continue current acute care  Time spent for the meeting: 42 mins  Samuel Kirk D. Tiburcio Pea, NP-C [facility] Personal contact information can be found on Amion  02/17/2022, 5:35 PM

## 2022-03-02 NOTE — Progress Notes (Signed)
Assisted with transporting PT to [facility] ICU [place] on BiPAP- uneventful. Once arrived in 1235 RT removed PT from BiPAP (very agitated) and placed on 6 LPM high flow nasal cannula system. RN and ICU RT aware.

## 2022-03-02 NOTE — Sepsis Progress Note (Signed)
Elink following Code Sepsis. 

## 2022-03-02 NOTE — Progress Notes (Signed)
Assisted with transferring PT while on BiPAP to [facility] ED 16- uneventful.

## 2022-03-02 DEATH — deceased
